# Patient Record
Sex: Female | Born: 1945 | Race: White | Hispanic: No | Marital: Married | Smoking: Current every day smoker
Health system: Southern US, Community
[De-identification: ages and names within clinical notes are randomized; demographics above are authoritative.]

## PROBLEM LIST (undated history)

## (undated) DIAGNOSIS — I10 Essential (primary) hypertension: Secondary | ICD-10-CM

## (undated) DIAGNOSIS — B019 Varicella without complication: Secondary | ICD-10-CM

## (undated) DIAGNOSIS — R42 Dizziness and giddiness: Secondary | ICD-10-CM

## (undated) DIAGNOSIS — M1990 Unspecified osteoarthritis, unspecified site: Secondary | ICD-10-CM

## (undated) DIAGNOSIS — R002 Palpitations: Secondary | ICD-10-CM

## (undated) DIAGNOSIS — J449 Chronic obstructive pulmonary disease, unspecified: Secondary | ICD-10-CM

## (undated) DIAGNOSIS — E785 Hyperlipidemia, unspecified: Secondary | ICD-10-CM

## (undated) HISTORY — PX: APPENDECTOMY: SHX54

## (undated) HISTORY — PX: COLONOSCOPY: SHX174

## (undated) HISTORY — PX: JOINT REPLACEMENT: SHX530

## (undated) HISTORY — PX: BUNIONECTOMY: SHX129

## (undated) HISTORY — PX: TOE SURGERY: SHX1073

---

## 1993-08-27 HISTORY — PX: BREAST EXCISIONAL BIOPSY: SUR124

## 2005-02-20 ENCOUNTER — Ambulatory Visit: Payer: Self-pay | Admitting: Internal Medicine

## 2005-12-17 ENCOUNTER — Ambulatory Visit: Payer: Self-pay | Admitting: Chiropractic Medicine

## 2006-02-21 ENCOUNTER — Ambulatory Visit: Payer: Self-pay | Admitting: Internal Medicine

## 2006-02-26 ENCOUNTER — Ambulatory Visit: Payer: Self-pay | Admitting: Internal Medicine

## 2006-09-19 ENCOUNTER — Emergency Department: Payer: Self-pay | Admitting: Emergency Medicine

## 2007-03-03 ENCOUNTER — Ambulatory Visit: Payer: Self-pay | Admitting: Internal Medicine

## 2008-01-09 ENCOUNTER — Other Ambulatory Visit: Payer: Self-pay

## 2008-01-09 ENCOUNTER — Inpatient Hospital Stay: Payer: Self-pay | Admitting: General Surgery

## 2008-01-09 ENCOUNTER — Ambulatory Visit: Payer: Self-pay

## 2008-05-04 ENCOUNTER — Ambulatory Visit: Payer: Self-pay | Admitting: Internal Medicine

## 2009-02-07 ENCOUNTER — Ambulatory Visit: Payer: Self-pay | Admitting: Internal Medicine

## 2009-03-25 ENCOUNTER — Ambulatory Visit: Payer: Self-pay | Admitting: Unknown Physician Specialty

## 2009-05-05 ENCOUNTER — Ambulatory Visit: Payer: Self-pay | Admitting: Internal Medicine

## 2009-10-25 ENCOUNTER — Ambulatory Visit: Payer: Self-pay | Admitting: General Practice

## 2010-05-10 ENCOUNTER — Ambulatory Visit: Payer: Self-pay | Admitting: Internal Medicine

## 2010-12-18 ENCOUNTER — Ambulatory Visit: Payer: Self-pay | Admitting: Unknown Physician Specialty

## 2011-11-21 ENCOUNTER — Ambulatory Visit: Payer: Self-pay | Admitting: Internal Medicine

## 2012-11-24 ENCOUNTER — Ambulatory Visit: Payer: Self-pay | Admitting: Internal Medicine

## 2014-01-25 ENCOUNTER — Ambulatory Visit: Payer: Self-pay | Admitting: Internal Medicine

## 2014-04-01 ENCOUNTER — Ambulatory Visit: Payer: Self-pay | Admitting: Orthopedic Surgery

## 2014-04-01 LAB — URINALYSIS, COMPLETE
BILIRUBIN, UR: NEGATIVE
Bacteria: NONE SEEN
Blood: NEGATIVE
GLUCOSE, UR: NEGATIVE mg/dL (ref 0–75)
Ketone: NEGATIVE
Leukocyte Esterase: NEGATIVE
Nitrite: NEGATIVE
PH: 6 (ref 4.5–8.0)
Protein: NEGATIVE
SPECIFIC GRAVITY: 1.017 (ref 1.003–1.030)
Squamous Epithelial: NONE SEEN
WBC UR: 1 /HPF (ref 0–5)

## 2014-04-01 LAB — BASIC METABOLIC PANEL
ANION GAP: 7 (ref 7–16)
BUN: 34 mg/dL — ABNORMAL HIGH (ref 7–18)
CO2: 26 mmol/L (ref 21–32)
CREATININE: 1.11 mg/dL (ref 0.60–1.30)
Calcium, Total: 9.6 mg/dL (ref 8.5–10.1)
Chloride: 104 mmol/L (ref 98–107)
EGFR (African American): 60 — ABNORMAL LOW
GFR CALC NON AF AMER: 52 — AB
Glucose: 84 mg/dL (ref 65–99)
OSMOLALITY: 281 (ref 275–301)
Potassium: 4 mmol/L (ref 3.5–5.1)
SODIUM: 137 mmol/L (ref 136–145)

## 2014-04-01 LAB — PROTIME-INR
INR: 1
Prothrombin Time: 12.6 secs (ref 11.5–14.7)

## 2014-04-01 LAB — CBC
HCT: 37.4 % (ref 35.0–47.0)
HGB: 12.8 g/dL (ref 12.0–16.0)
MCH: 35 pg — AB (ref 26.0–34.0)
MCHC: 34.2 g/dL (ref 32.0–36.0)
MCV: 102 fL — AB (ref 80–100)
Platelet: 282 10*3/uL (ref 150–440)
RBC: 3.65 10*6/uL — ABNORMAL LOW (ref 3.80–5.20)
RDW: 13.2 % (ref 11.5–14.5)
WBC: 9.3 10*3/uL (ref 3.6–11.0)

## 2014-04-01 LAB — APTT: Activated PTT: 30.9 secs (ref 23.6–35.9)

## 2014-04-01 LAB — MRSA PCR SCREENING

## 2014-04-01 LAB — SEDIMENTATION RATE: ERYTHROCYTE SED RATE: 21 mm/h (ref 0–30)

## 2014-04-15 ENCOUNTER — Inpatient Hospital Stay: Payer: Self-pay | Admitting: Orthopedic Surgery

## 2014-04-16 LAB — BASIC METABOLIC PANEL
Anion Gap: 9 (ref 7–16)
BUN: 19 mg/dL — ABNORMAL HIGH (ref 7–18)
CALCIUM: 8.7 mg/dL (ref 8.5–10.1)
CHLORIDE: 103 mmol/L (ref 98–107)
Co2: 28 mmol/L (ref 21–32)
Creatinine: 1.1 mg/dL (ref 0.60–1.30)
EGFR (African American): >60
EGFR (Non-African Amer.): 52 — ABNORMAL LOW
Glucose: 100 mg/dL — ABNORMAL HIGH (ref 65–99)
OSMOLALITY: 282 (ref 275–301)
Potassium: 3.7 mmol/L (ref 3.5–5.1)
Sodium: 140 mmol/L (ref 136–145)

## 2014-04-16 LAB — HEMOGLOBIN: HGB: 10.8 g/dL — ABNORMAL LOW (ref 12.0–16.0)

## 2014-04-16 LAB — PLATELET COUNT: PLATELETS: 275 10*3/uL (ref 150–440)

## 2014-04-17 LAB — BASIC METABOLIC PANEL
Anion Gap: 6 — ABNORMAL LOW (ref 7–16)
BUN: 12 mg/dL (ref 7–18)
CALCIUM: 8.9 mg/dL (ref 8.5–10.1)
CHLORIDE: 102 mmol/L (ref 98–107)
CO2: 28 mmol/L (ref 21–32)
Creatinine: 0.95 mg/dL (ref 0.60–1.30)
EGFR (African American): >60
Glucose: 95 mg/dL (ref 65–99)
OSMOLALITY: 272 (ref 275–301)
Potassium: 3.5 mmol/L (ref 3.5–5.1)
SODIUM: 136 mmol/L (ref 136–145)

## 2014-04-17 LAB — HEMOGLOBIN: HGB: 10 g/dL — AB (ref 12.0–16.0)

## 2014-04-19 ENCOUNTER — Encounter: Payer: Self-pay | Admitting: Internal Medicine

## 2014-04-20 LAB — PATHOLOGY REPORT

## 2014-04-27 ENCOUNTER — Encounter: Payer: Self-pay | Admitting: Internal Medicine

## 2014-06-13 HISTORY — PX: TOTAL HIP ARTHROPLASTY: SHX124

## 2014-12-18 NOTE — Op Note (Signed)
PATIENT NAME:  Tabitha Miller, Tabitha Miller MR#:  [id number] DATE OF BIRTH:  [birth year]  DATE OF PROCEDURE:  04/15/2014  PREOPERATIVE DIAGNOSES:  Severe left hip osteoarthritis.   POSTOPERATIVE DIAGNOSES:  Severe left hip osteoarthritis.   PROCEDURE: Left total hip replacement, direct anterior approach.   ANESTHESIA: Spinal.   SURGEON:  Laurene Footman, MD   DESCRIPTION OF PROCEDURE: The patient was brought to the operating room, and after adequate anesthesia was obtained, the patient was placed on the operative table with the right leg on a well-padded table, left foot in the Medacta attachment. After prepping and draping, and C-arm brought in to get good visualization of the proximal femur, and initial template, timeout procedure was completed with all patient identification, and an incision was made centered over the tensor fascia muscle, and the greater trochanter. The incision was carried down through the skin, and subcutaneous tissue in the direction of the TFL. The TFL fascia was incised. The muscle belly retracted laterally. The deep retractors placed, and the circumflex vessels cauterized as they were quite small. The anterior capsule was then exposed, and a capsulotomy created, which gave good visualization of the neck, and a good flap for subsequent retractor placement.   C-arm was brought in for assessment of the appropriate level of the femoral neck cut, and the femoral neck cut was carried out without difficulty, with the head removed, showing extensive wear, also present were loose bodies before the neck cut in the anterior joint space. The acetabulum also had extensive wear. The labrum was excised. Reaming was carried out to 52 mm, a 52 mm trial fit well with good bleeding bone present within the acetabulum; the 52 mm Versafitcup DM was impacted into place, and appeared to have appropriate lateral opening, and anteversion.   Next, the leg was externally rotated, the pubofemoral and ischiofemoral  ligaments released, and the leg dropped into extension. Sequential broaching was carried out to a size 3, which gave very good fill to the canal with a small, S 28 mm head trial. The hip was stable, and the leg length restored with normal offset. The final components were then obtained, and the AMIS 3 stem impacted followed by assembly of the dual mobility head. The head was assembled onto the stem impacted. The hip was reduced, and was stable at 90 degrees with traction.   The final x-ray taken intraoperatively showed appropriate position of components. The hip was thoroughly irrigated, hemostasis achieved with electrocautery. The deep tissue was infiltrated with 30 mL of 0.25% Sensorcaine with epinephrine. The tensor fascia was repaired using a heavy quill followed by 2-0 quill subcutaneously, and skin staples. Xeroform, 4 x 4's, ABD, and tape applied. The patient was sent to the recovery room in stable condition.   ESTIMATED BLOOD LOSS:  Was 400 mL.   COMPLICATIONS: None.   SPECIMEN: Removed femoral head.   IMPLANTS: Medacta Versafitcup DM 52 mm with liner S 28 mm head, and a size 3 standard AMIS stem.    ____________________________ Laurene Footman, MD mjm:nt D: 04/15/2014 17:52:45 ET T: 04/15/2014 18:25:26 ET JOB#: [id number]  cc: Laurene Footman, MD, <Dictator> Laurene Footman MD ELECTRONICALLY SIGNED 04/16/2014 7:15

## 2014-12-18 NOTE — Discharge Summary (Signed)
PATIENT NAME:  Tabitha Miller, Tabitha Miller MR#:  [id number] DATE OF BIRTH:  1946-05-08  DATE OF ADMISSION:  04/15/2014 DATE OF DISCHARGE:  04/19/2014    ADMITTING DIAGNOSIS:  Severe left hip osteoarthritis.   DISCHARGE DIAGNOSIS:  Severe left hip osteoarthritis.  OPERATION: On 04/15/2014 the patient had a left total hip replacement using anterior approach.   ANESTHESIA: Spinal.   SURGEON: Hessie Knows, M.D.   ESTIMATED BLOOD LOSS: 400 mL.   COMPLICATIONS: None.   IMPLANTS:  Medacta Versafitcup DM 52 mm with liner, S 28-mm head, size 3 standard AMIS stem.  The patient was stabilized, brought to the recovery room, and then brought down to the orthopedic floor.   HISTORY: The patient is a 69 year old female who presented for an upcoming left total hip replacement. The patient has difficulty with activities of daily living, even sitting in a car and some difficulty with driving. She has been refractory to conservative treatment.   PHYSICAL EXAMINATION:  GENERAL: Well-developed, well-nourished female with some difficulty with ambulation.  HEART: Regular rate and rhythm.  LUNGS: Clear to auscultation.  MUSCULOSKELETAL: In regard to the left lower extremity, the patient has a moderate limp with equal leg lengths. The patient has hip flexion that is 110 degrees, full extension and 15 degrees abduction with pain. The patient has limited external rotation at 30 degrees and internal rotation is neutral. The patient has x-rays revealing degenerative collapsed bone-on-bone pathology.   HOSPITAL COURSE: After initial admission on 04/15/2014, the patient was brought to the orthopedic floor. On postoperative day 1, the patient had a hemoglobin of 10.8, which dropped down to 10.0 on postoperative day 2. Her other labs remained stable. The patient worked with physical therapy, initially bed to chair and progressed up to ambulating 80 feet with therapy and doing quite well. The patient had some difficulties because of a  right shoulder problem with trying to get up from the bed. The patient was then sent to rehab on 04/19/2014.   CONDITION AT DISCHARGE: Stable.   DISPOSITION: The patient was sent to rehabilitation for physical therapy and occupational therapy.   DISCHARGE INSTRUCTIONS: The patient will do weight-bearing as tolerated on the affected leg. The patient will elevate with 1-2 pillows to decrease swelling. The patient will use anterior hip precautions. The patient will use knee-high TED hose on both legs, removed 1 hour every 8-hour shift. The patient will elevate her heels and use the incentive spirometer every hour while awake and be encouraged to do cough and deep breathing. The patient will have a regular diet. The patient will keep her dressing clean and dry, try not to get it wet, and have a dressing change on a p.r.n. basis. The patient will call the clinic if there is any bright red bleeding, calf pain, bowel or bladder difficulty, or any fever greater than 101.5. The patient will do physical therapy per protocol and occupational therapy per protocol. The patient will follow up in 2 weeks for staple removal with Dr. Rudene Christians.   DISCHARGE MEDICATIONS: Vitamin D3 1000 International Units 3 capsules daily, vitamin C 500 mg 2 tablets twice a day, flaxseed oil 3 capsules daily, ginkgo biloba 1 capsule daily, Biotin 1 capsule daily, garlic 2 capsules daily, Zyrtec 2 mg 1 tablet daily, calcium with vitamin D 3 tablets daily, red yeast rice 600 mg 2 tablets daily, Centrum Silver Women's vitamin 1 tablet daily, zinc gluconate 50 mg 1 tablet daily, vitamin E 400 International Units 1 capsule daily, magnesium oxide 400  mg 1 tablet daily, hydrochlorothiazide/triamterene 25/37.5 mg 1 tablet daily, atenolol 25 mg 1 tablet at bedtime, fluoxetine 20 mg 1 capsule at bedtime, turmeric oral 1 capsule daily, grape seed extract 200 mg 1 capsule daily, Naphcon ophthalmic solution 1 drop in each eye daily, green tea extract 315 mg 2  capsules daily, oil of oregano 1500 mg 1 capsule daily, elderberry sambucus 400 mg 1 capsule daily, glucosamine chondroitin 2 capsules daily, tramadol 50 mg 1 tablet q. 6 hours p.r.n. for less severe pain, Tylenol 500 mg 1 tablet q. 4 hours for pain or fever, oxycodone 10 mg extended release 1. 12 hours, oxycodone 5 mg 1 tablet q. 4 hours as needed for moderate to severe pain, Lovenox 40 mg subcutaneously once a day for 14 days, then discontinue and begin aspirin 81 mg daily, Senokot-S 1 tablet b.i.d., bisacodyl 10 mg rectally p.r.n. for constipation.    ____________________________ Lenna Sciara. Reche Dixon, [place] jtm:lt D: 04/19/2014 07:39:26 ET T: 04/19/2014 08:38:08 ET JOB#: [id number]  cc: J. Reche Dixon, [place], <Dictator> J Analysse Quinonez [facility] ELECTRONICALLY SIGNED 04/20/2014 8:42

## 2014-12-18 NOTE — Op Note (Signed)
PATIENT NAME:  Tabitha Miller, Tabitha Miller MR#:  [id number] DATE OF BIRTH:  Jan 16, 1946  DATE OF PROCEDURE:  04/15/2014  ADDENDUM:  ADDITIONAL DIAGNOSIS: Right shoulder osteoarthritis.   POSTOPERATIVE DIAGNOSIS: Right shoulder osteoarthritis.  PROCEDURE: Right shoulder intra-articular injection.   ANESTHESIA: Spinal with sedation.   DESCRIPTION OF PROCEDURE: The patient identification and timeout procedures were completed. The anterior shoulder was prepped with Betadine. A 25-gauge needle was inserted into the joint and the shoulder gently rotated to make certain we were inside the joint. Then, 40 mg of Kenalog 1 mL was injected without difficulty and a Band-Aid applied. The patient tolerated the procedure well. Following this, the total hip was performed under a separate operative note.   ____________________________ Laurene Footman, MD mjm:TT D: 04/15/2014 21:26:49 ET T: 04/15/2014 21:34:59 ET JOB#: [id number]  cc: Laurene Footman, MD, <Dictator> Laurene Footman MD ELECTRONICALLY SIGNED 04/16/2014 7:15

## 2015-01-04 ENCOUNTER — Other Ambulatory Visit: Payer: Self-pay | Admitting: Internal Medicine

## 2015-01-04 DIAGNOSIS — Z1231 Encounter for screening mammogram for malignant neoplasm of breast: Secondary | ICD-10-CM

## 2015-01-27 ENCOUNTER — Ambulatory Visit: Payer: Self-pay

## 2015-02-04 ENCOUNTER — Ambulatory Visit
Admission: RE | Admit: 2015-02-04 | Discharge: 2015-02-04 | Disposition: A | Discharge status: Home / Self Care | Payer: Medicare PPO | Source: Ambulatory Visit | Attending: Internal Medicine | Admitting: Internal Medicine

## 2015-02-04 ENCOUNTER — Other Ambulatory Visit: Payer: Self-pay | Admitting: Internal Medicine

## 2015-02-04 DIAGNOSIS — Z1231 Encounter for screening mammogram for malignant neoplasm of breast: Secondary | ICD-10-CM

## 2016-01-13 ENCOUNTER — Other Ambulatory Visit: Payer: Self-pay | Admitting: Internal Medicine

## 2016-01-13 DIAGNOSIS — Z1231 Encounter for screening mammogram for malignant neoplasm of breast: Secondary | ICD-10-CM

## 2016-02-06 ENCOUNTER — Other Ambulatory Visit: Payer: Self-pay | Admitting: Internal Medicine

## 2016-02-06 ENCOUNTER — Ambulatory Visit
Admission: RE | Admit: 2016-02-06 | Discharge: 2016-02-06 | Disposition: A | Discharge status: Home / Self Care | Payer: Medicare Other | Source: Ambulatory Visit | Attending: Internal Medicine | Admitting: Internal Medicine

## 2016-02-06 DIAGNOSIS — Z1231 Encounter for screening mammogram for malignant neoplasm of breast: Secondary | ICD-10-CM | POA: Diagnosis present

## 2016-02-17 ENCOUNTER — Other Ambulatory Visit: Payer: Self-pay | Admitting: Internal Medicine

## 2016-02-17 DIAGNOSIS — R1084 Generalized abdominal pain: Secondary | ICD-10-CM

## 2016-02-24 ENCOUNTER — Ambulatory Visit
Admission: RE | Admit: 2016-02-24 | Discharge: 2016-02-24 | Disposition: A | Discharge status: Home / Self Care | Payer: Medicare Other | Source: Ambulatory Visit | Attending: Internal Medicine | Admitting: Internal Medicine

## 2016-02-24 DIAGNOSIS — K824 Cholesterolosis of gallbladder: Secondary | ICD-10-CM | POA: Diagnosis not present

## 2016-02-24 DIAGNOSIS — N281 Cyst of kidney, acquired: Secondary | ICD-10-CM | POA: Diagnosis not present

## 2016-02-24 DIAGNOSIS — N1330 Unspecified hydronephrosis: Secondary | ICD-10-CM | POA: Insufficient documentation

## 2016-02-24 DIAGNOSIS — R1084 Generalized abdominal pain: Secondary | ICD-10-CM | POA: Insufficient documentation

## 2016-02-29 ENCOUNTER — Encounter: Payer: Self-pay | Admitting: *Deleted

## 2016-03-02 NOTE — Discharge Instructions (Signed)
[facility] °[facility] ° °POST OPERATIVE INSTRUCTIONS FOR DR. TROXLER AND DR. FOWLER °[facility] ° ° °1. Take your medication as prescribed.  Pain medication should be taken only as needed. ° °2. Keep the dressing clean, dry and intact. ° °3. Keep your foot elevated above the heart level for the first 48 hours. ° °4. Walking to the bathroom and brief periods of walking are acceptable, unless we have instructed you to be non-weight bearing. ° °5. Always wear your post-op shoe when walking.  Always use your crutches if you are to be non-weight bearing. ° °6. Do not take a shower. Baths are permissible as long as the foot is kept out of the water.  ° °7. Every hour you are awake:  °- Bend your knee 15 times. °- Flex foot 15 times °- Massage calf 15 times ° °8. Call [facility] ([phone]) if any of the following problems occur: °- You develop a temperature or fever. °- The bandage becomes saturated with blood. °- Medication does not stop your pain. °- Injury of the foot occurs. °- Any symptoms of infection including redness, odor, or red streaks running from wound. ° °General Anesthesia, Adult, Care After °Refer to this sheet in the next few weeks. These instructions provide you with information on caring for yourself after your procedure. Your health care provider may also give you more specific instructions. Your treatment has been planned according to current medical practices, but problems sometimes occur. Call your health care provider if you have any problems or questions after your procedure. °WHAT TO EXPECT AFTER THE PROCEDURE °After the procedure, it is typical to experience: °· Sleepiness. °· Nausea and vomiting. °HOME CARE INSTRUCTIONS °· For the first 24 hours after general anesthesia: °¨ Have a responsible person with you. °¨ Do not drive a car. If you are alone, do not take public transportation. °¨ Do not drink alcohol. °¨ Do not take  medicine that has not been prescribed by your health care provider. °¨ Do not sign important papers or make important decisions. °¨ You may resume a normal diet and activities as directed by your health care provider. °· Change bandages (dressings) as directed. °· If you have questions or problems that seem related to general anesthesia, call the hospital and ask for the anesthetist or anesthesiologist on call. °SEEK MEDICAL CARE IF: °· You have nausea and vomiting that continue the day after anesthesia. °· You develop a rash. °SEEK IMMEDIATE MEDICAL CARE IF:  °· You have difficulty breathing. °· You have chest pain. °· You have any allergic problems. °  °This information is not intended to replace advice given to you by your health care provider. Make sure you discuss any questions you have with your health care provider. °  °Document Released: 11/19/2000 Document Revised: 09/03/2014 Document Reviewed: 12/12/2011 °Elsevier Interactive Patient Education ©2016 [employer] ° °

## 2016-03-08 ENCOUNTER — Ambulatory Visit: Payer: Medicare Other | Admitting: Anesthesiology

## 2016-03-08 ENCOUNTER — Encounter
Admission: RE | Disposition: A | Discharge status: Home / Self Care | Payer: Self-pay | Source: Ambulatory Visit | Attending: Podiatry

## 2016-03-08 ENCOUNTER — Ambulatory Visit
Admission: RE | Admit: 2016-03-08 | Discharge: 2016-03-08 | Disposition: A | Discharge status: Home / Self Care | Payer: Medicare Other | Source: Ambulatory Visit | Attending: Podiatry | Admitting: Podiatry

## 2016-03-08 DIAGNOSIS — I10 Essential (primary) hypertension: Secondary | ICD-10-CM | POA: Diagnosis not present

## 2016-03-08 DIAGNOSIS — J449 Chronic obstructive pulmonary disease, unspecified: Secondary | ICD-10-CM | POA: Diagnosis not present

## 2016-03-08 DIAGNOSIS — G8929 Other chronic pain: Secondary | ICD-10-CM | POA: Insufficient documentation

## 2016-03-08 DIAGNOSIS — F17200 Nicotine dependence, unspecified, uncomplicated: Secondary | ICD-10-CM | POA: Diagnosis not present

## 2016-03-08 DIAGNOSIS — M1990 Unspecified osteoarthritis, unspecified site: Secondary | ICD-10-CM | POA: Insufficient documentation

## 2016-03-08 DIAGNOSIS — D1631 Benign neoplasm of short bones of right lower limb: Secondary | ICD-10-CM | POA: Insufficient documentation

## 2016-03-08 DIAGNOSIS — M898X7 Other specified disorders of bone, ankle and foot: Secondary | ICD-10-CM

## 2016-03-08 HISTORY — DX: Unspecified osteoarthritis, unspecified site: M19.90

## 2016-03-08 HISTORY — DX: Dizziness and giddiness: R42

## 2016-03-08 HISTORY — DX: Chronic obstructive pulmonary disease, unspecified: J44.9

## 2016-03-08 HISTORY — DX: Hyperlipidemia, unspecified: E78.5

## 2016-03-08 HISTORY — DX: Essential (primary) hypertension: I10

## 2016-03-08 HISTORY — PX: EXCISION PARTIAL PHALANX: SHX6617

## 2016-03-08 HISTORY — PX: STERIOD INJECTION: SHX5046

## 2016-03-08 SURGERY — EXCISION, PHALANX, PARTIAL
Anesthesia: Monitor Anesthesia Care | Site: Foot | Laterality: Right | Wound class: Clean

## 2016-03-08 MED ORDER — OXYCODONE HCL 5 MG PO TABS: 5.0000 mg | ORAL_TABLET | Freq: Once | ORAL | Status: DC | PRN

## 2016-03-08 MED ORDER — HYDROMORPHONE HCL 1 MG/ML IJ SOLN: 0.2500 mg | INTRAMUSCULAR | Status: DC | PRN

## 2016-03-08 MED ORDER — BUPIVACAINE HCL 0.5 % IJ SOLN
INTRAMUSCULAR | Status: DC | PRN
  Administered 2016-03-08: 2 mL via INTRAMUSCULAR

## 2016-03-08 MED ORDER — LIDOCAINE HCL (PF) 1 % IJ SOLN
INTRAMUSCULAR | Status: DC | PRN
  Administered 2016-03-08: 1 mL

## 2016-03-08 MED ORDER — OXYCODONE HCL 5 MG/5ML PO SOLN: 5.0000 mg | Freq: Once | ORAL | Status: DC | PRN

## 2016-03-08 MED ORDER — PROMETHAZINE HCL 25 MG/ML IJ SOLN: 6.2500 mg | INTRAMUSCULAR | Status: DC | PRN

## 2016-03-08 MED ORDER — LACTATED RINGERS IV SOLN
INTRAVENOUS | Status: DC
  Administered 2016-03-08: 07:00:00 via INTRAVENOUS

## 2016-03-08 MED ORDER — PROPOFOL 500 MG/50ML IV EMUL
INTRAVENOUS | Status: DC | PRN
  Administered 2016-03-08: 75 ug/kg/min via INTRAVENOUS
  Administered 2016-03-08: 100 ug/kg/min via INTRAVENOUS

## 2016-03-08 MED ORDER — LIDOCAINE HCL (CARDIAC) 20 MG/ML IV SOLN
INTRAVENOUS | Status: DC | PRN
  Administered 2016-03-08: 50 mg via INTRAVENOUS

## 2016-03-08 MED ORDER — MEPERIDINE HCL 25 MG/ML IJ SOLN: 6.2500 mg | INTRAMUSCULAR | Status: DC | PRN

## 2016-03-08 MED ORDER — MIDAZOLAM HCL 2 MG/2ML IJ SOLN
INTRAMUSCULAR | Status: DC | PRN
  Administered 2016-03-08: 2 mg via INTRAVENOUS

## 2016-03-08 MED ORDER — HYDROCODONE-ACETAMINOPHEN 7.5-325 MG PO TABS: 1.0000 | ORAL_TABLET | Freq: Four times a day (QID) | ORAL | Status: DC | PRN

## 2016-03-08 MED ORDER — CEFAZOLIN SODIUM-DEXTROSE 2-4 GM/100ML-% IV SOLN
2.0000 g | Freq: Once | INTRAVENOUS | Status: AC
  Administered 2016-03-08: 2 g via INTRAVENOUS

## 2016-03-08 MED ORDER — LACTATED RINGERS IV SOLN: INTRAVENOUS | Status: DC

## 2016-03-08 MED ORDER — BUPIVACAINE HCL (PF) 0.5 % IJ SOLN
INTRAMUSCULAR | Status: DC | PRN
  Administered 2016-03-08: 1 mL

## 2016-03-08 MED ORDER — FENTANYL CITRATE (PF) 100 MCG/2ML IJ SOLN
INTRAMUSCULAR | Status: DC | PRN
  Administered 2016-03-08: 50 ug via INTRAVENOUS

## 2016-03-08 SURGICAL SUPPLY — 63 items
BANDAGE ELASTIC 4 CLIP NS LF (GAUZE/BANDAGES/DRESSINGS) ×4 IMPLANT
BENZOIN TINCTURE PRP APPL 2/3 (GAUZE/BANDAGES/DRESSINGS) ×4 IMPLANT
BLADE CRESCENTIC (BLADE) IMPLANT
BLADE MED AGGRESSIVE (BLADE) IMPLANT
BLADE MINI RND TIP GREEN BEAV (BLADE) IMPLANT
BLADE OSC/SAGITTAL 5.5X25 (BLADE) IMPLANT
BLADE OSC/SAGITTAL MD 5.5X18 (BLADE) IMPLANT
BLADE OSC/SAGITTAL MD 9X18.5 (BLADE) IMPLANT
BLADE OSCILLATING/SAGITTAL (BLADE)
BLADE SW THK.38XMED LNG THN (BLADE) IMPLANT
BNDG ESMARK 4X12 TAN STRL LF (GAUZE/BANDAGES/DRESSINGS) ×4 IMPLANT
BNDG GAUZE 4.5X4.1 6PLY STRL (MISCELLANEOUS) ×4 IMPLANT
BNDG STRETCH 4X75 STRL LF (GAUZE/BANDAGES/DRESSINGS) ×4 IMPLANT
BUR EGG 4X8 MED (BURR) IMPLANT
BUR STRYKR EGG 5.0 (BURR) IMPLANT
CANISTER SUCT 1200ML W/VALVE (MISCELLANEOUS) ×4 IMPLANT
CAST PADDING 3X4FT ST 30246 (SOFTGOODS)
CLOSURE WOUND 1/4X4 (GAUZE/BANDAGES/DRESSINGS)
COVER LIGHT HANDLE UNIVERSAL (MISCELLANEOUS) ×8 IMPLANT
COVER PIN YLW 0.028-062 (MISCELLANEOUS) IMPLANT
CUFF TOURN SGL QUICK 18 (TOURNIQUET CUFF) ×4 IMPLANT
DRAPE FLUOR MINI C-ARM 54X84 (DRAPES) ×4 IMPLANT
DRILL WIRE PASS (DRILL) IMPLANT
DURAPREP 26ML APPLICATOR (WOUND CARE) ×4 IMPLANT
GAUZE PETRO XEROFOAM 1X8 (MISCELLANEOUS) ×4 IMPLANT
GAUZE SPONGE 4X4 12PLY STRL (GAUZE/BANDAGES/DRESSINGS) ×4 IMPLANT
GLOVE BIO SURGEON STRL SZ8 (GLOVE) ×4 IMPLANT
GOWN STRL REUS W/ TWL LRG LVL3 (GOWN DISPOSABLE) ×2 IMPLANT
GOWN STRL REUS W/ TWL XL LVL3 (GOWN DISPOSABLE) ×2 IMPLANT
GOWN STRL REUS W/TWL LRG LVL3 (GOWN DISPOSABLE) ×2
GOWN STRL REUS W/TWL XL LVL3 (GOWN DISPOSABLE) ×2
K-WIRE DBL END TROCAR 6X.045 (WIRE)
K-WIRE DBL END TROCAR 6X.062 (WIRE)
KIT ROOM TURNOVER OR (KITS) ×4 IMPLANT
KWIRE DBL END TROCAR 6X.045 (WIRE) IMPLANT
KWIRE DBL END TROCAR 6X.062 (WIRE) IMPLANT
NEEDLE HYPO 18GX1.5 BLUNT FILL (NEEDLE) IMPLANT
NEEDLE HYPO 25GX1X1/2 BEV (NEEDLE) IMPLANT
NS IRRIG 500ML POUR BTL (IV SOLUTION) ×4 IMPLANT
PACK EXTREMITY ARMC (MISCELLANEOUS) ×4 IMPLANT
PAD CAST CTTN 3X4 STRL (SOFTGOODS) IMPLANT
PAD GROUND ADULT SPLIT (MISCELLANEOUS) ×4 IMPLANT
RASP SM TEAR CROSS CUT (RASP) ×4 IMPLANT
SPLINT CAST 1 STEP 4X30 (MISCELLANEOUS) ×4 IMPLANT
SPLINT FAST PLASTER 5X30 (CAST SUPPLIES)
SPLINT PLASTER CAST FAST 5X30 (CAST SUPPLIES) IMPLANT
STOCKINETTE STRL 6IN 960660 (GAUZE/BANDAGES/DRESSINGS) ×4 IMPLANT
STRAP BODY AND KNEE 60X3 (MISCELLANEOUS) ×4 IMPLANT
STRIP CLOSURE SKIN 1/4X4 (GAUZE/BANDAGES/DRESSINGS) IMPLANT
SUT ETHILON 4-0 (SUTURE)
SUT ETHILON 4-0 FS2 18XMFL BLK (SUTURE)
SUT ETHILON 5-0 FS-2 18 BLK (SUTURE) ×4 IMPLANT
SUT VIC AB 1 CT1 36 (SUTURE) ×4 IMPLANT
SUT VIC AB 2-0 CT1 27 (SUTURE)
SUT VIC AB 2-0 CT1 TAPERPNT 27 (SUTURE) IMPLANT
SUT VIC AB 2-0 SH 27 (SUTURE)
SUT VIC AB 2-0 SH 27XBRD (SUTURE) IMPLANT
SUT VIC AB 3-0 SH 27 (SUTURE)
SUT VIC AB 3-0 SH 27X BRD (SUTURE) IMPLANT
SUT VIC AB 4-0 FS2 27 (SUTURE) IMPLANT
SUT VICRYL AB 3-0 FS1 BRD 27IN (SUTURE) IMPLANT
SUTURE ETHLN 4-0 FS2 18XMF BLK (SUTURE) IMPLANT
SYRINGE 10CC LL (SYRINGE) IMPLANT

## 2016-03-08 NOTE — Anesthesia Postprocedure Evaluation (Signed)
Anesthesia Post Note  Patient: Tabitha Miller  Procedure(s) Performed: Procedure(s) (LRB): EXCISION PARTIAL PHALANX 5TH RT TOE (Right)  Patient location during evaluation: PACU Anesthesia Type: MAC Level of consciousness: awake and alert and oriented Pain management: pain level controlled Vital Signs Assessment: post-procedure vital signs reviewed and stable Respiratory status: spontaneous breathing and nonlabored ventilation Cardiovascular status: stable Postop Assessment: no signs of nausea or vomiting and adequate PO intake Anesthetic complications: no    Estill Batten

## 2016-03-08 NOTE — Transfer of Care (Signed)
Immediate Anesthesia Transfer of Care Note  Patient: Tabitha Miller  Procedure(s) Performed: Procedure(s) with comments: EXCISION PARTIAL PHALANX 5TH RT TOE (Right) - IVA WITH LOCAL  Patient Location: PACU  Anesthesia Type: MAC  Level of Consciousness: awake, alert  and patient cooperative  Airway and Oxygen Therapy: Patient Spontanous Breathing and Patient connected to supplemental oxygen  Post-op Assessment: Post-op Vital signs reviewed, Patient's Cardiovascular Status Stable, Respiratory Function Stable, Patent Airway and No signs of Nausea or vomiting  Post-op Vital Signs: Reviewed and stable  Complications: No apparent anesthesia complications

## 2016-03-08 NOTE — Op Note (Signed)
Operative note   Surgeon: Dr. Albertine Patricia, DPM.    Assistant: None    Preop diagnosis: Exostosis fifth toe right foot    Postop diagnosis: Same    Procedure:   1. Phalanx head resection proximal phalanx and partial middle phalanx resection fifth toe right foot          EBL: Less than 5 cc    Anesthesia:IV sedation delivered by anesthesia team and local block with 0.5% Marcaine and 1% lidocaine mixed 50-50 injected the base of the toe tablet 2 cc were used.    Hemostasis: Ankle tourniquet 250 mils mercury pressure    Specimen: None    Complications: None    Operative indications: Chronic pain and recurrence of a painful lesion in between the fourth and fifth toes secondary to the exostosis. Nonresponsive to conservative care    Procedure:  Patient was brought into the OR and placed on the operating table in thesupine position. After anesthesia was obtained theright lower extremity was prepped and draped in usual sterile fashion.  Operative Report: Attention was directed to the fifth toe of the right foot where a 1.5 cm linear incision was made and deepened sharp blunt dissection. The extensor tendon was identified and incised transversely reflected proximally the PIPJ level. The proximal phalanx was identified and resected with power equipment. His to the middle phalanx medially was then freed up and the sagittal saw was used to make a cut through the medial one fourth to remove that segment of the proximal phalanx which is prominent also. At this point a power rasp was used to smooth down both proximal phalanx head as well as the middle phalanx medial body. There is an copiously irrigated checked FluoroScan adequate reduction removal bone was noted. After copious irrigation the extensor tendon and medial lateral collateral ligaments were sutured with 4-0 Vicryl simple interrupted sutures. Skin was reapproximated with 5-0 nylon horizontal mattress and simple interrupted sutures.  There is a blocked with 0.5% Marcaine plain and 1 cc. A sterile compressive dressings were placed across wound consisting of 0 gauze 4 x 4's Kling Kerlix. Tourniquet was released and prompt complete vascularity was seen to return all digits of the right foot.    Patient tolerated the procedure and anesthesia well.  Was transported from the OR to the PACU with all vital signs stable and vascular status intact. To be discharged per routine protocol.  Will follow up in approximately 1 week in the outpatient clinic.

## 2016-03-08 NOTE — Anesthesia Preprocedure Evaluation (Signed)
Anesthesia Evaluation  Patient identified by MRN, date of birth, ID band Patient awake    Reviewed: Allergy & Precautions, NPO status , Unable to perform ROS - Chart review only  Airway Mallampati: I  TM Distance: >3 FB Neck ROM: Full    Dental no notable dental hx.    Pulmonary COPD, Current Smoker,    Pulmonary exam normal        Cardiovascular hypertension, Normal cardiovascular exam     Neuro/Psych negative neurological ROS  negative psych ROS   GI/Hepatic negative GI ROS, Neg liver ROS,   Endo/Other  negative endocrine ROS  Renal/GU negative Renal ROS     Musculoskeletal  (+) Arthritis , Osteoarthritis,    Abdominal   Peds  Hematology negative hematology ROS (+)   Anesthesia Other Findings   Reproductive/Obstetrics                             Anesthesia Physical Anesthesia Plan  ASA: II  Anesthesia Plan: MAC   Post-op Pain Management:    Induction: Intravenous  Airway Management Planned:   Additional Equipment:   Intra-op Plan:   Post-operative Plan:   Informed Consent: I have reviewed the patients History and Physical, chart, labs and discussed the procedure including the risks, benefits and alternatives for the proposed anesthesia with the patient or authorized representative who has indicated his/her understanding and acceptance.     Plan Discussed with: CRNA  Anesthesia Plan Comments:         Anesthesia Quick Evaluation

## 2016-03-08 NOTE — H&P (Signed)
H and P has been reviewed and no changes are noted.  

## 2016-03-08 NOTE — Anesthesia Procedure Notes (Signed)
Procedure Name: MAC Performed by: Maiko Salais Pre-anesthesia Checklist: Patient identified, Emergency Drugs available, Suction available, Patient being monitored and Timeout performed Patient Re-evaluated:Patient Re-evaluated prior to inductionOxygen Delivery Method: Simple face mask Placement Confirmation: positive ETCO2 and breath sounds checked- equal and bilateral       

## 2016-03-09 ENCOUNTER — Encounter: Payer: Self-pay | Admitting: Podiatry

## 2017-03-28 ENCOUNTER — Other Ambulatory Visit: Payer: Self-pay | Admitting: Internal Medicine

## 2017-03-28 DIAGNOSIS — Z1231 Encounter for screening mammogram for malignant neoplasm of breast: Secondary | ICD-10-CM

## 2017-04-24 ENCOUNTER — Ambulatory Visit
Admission: RE | Admit: 2017-04-24 | Discharge: 2017-04-24 | Disposition: A | Discharge status: Home / Self Care | Payer: Medicare Other | Source: Ambulatory Visit | Attending: Internal Medicine | Admitting: Internal Medicine

## 2017-04-24 DIAGNOSIS — Z1231 Encounter for screening mammogram for malignant neoplasm of breast: Secondary | ICD-10-CM | POA: Diagnosis not present

## 2017-07-10 ENCOUNTER — Other Ambulatory Visit: Payer: Self-pay | Admitting: Orthopedic Surgery

## 2017-07-10 DIAGNOSIS — M1711 Unilateral primary osteoarthritis, right knee: Secondary | ICD-10-CM

## 2017-07-10 DIAGNOSIS — M1712 Unilateral primary osteoarthritis, left knee: Secondary | ICD-10-CM

## 2017-07-17 ENCOUNTER — Ambulatory Visit
Admission: RE | Admit: 2017-07-17 | Discharge: 2017-07-17 | Disposition: A | Discharge status: Home / Self Care | Payer: Medicare Other | Source: Ambulatory Visit | Attending: Orthopedic Surgery | Admitting: Orthopedic Surgery

## 2017-07-17 DIAGNOSIS — M170 Bilateral primary osteoarthritis of knee: Secondary | ICD-10-CM | POA: Insufficient documentation

## 2017-07-17 DIAGNOSIS — M25462 Effusion, left knee: Secondary | ICD-10-CM | POA: Insufficient documentation

## 2017-07-17 DIAGNOSIS — M7121 Synovial cyst of popliteal space [Baker], right knee: Secondary | ICD-10-CM | POA: Diagnosis not present

## 2017-07-17 DIAGNOSIS — M25461 Effusion, right knee: Secondary | ICD-10-CM | POA: Diagnosis not present

## 2017-07-17 DIAGNOSIS — M1712 Unilateral primary osteoarthritis, left knee: Secondary | ICD-10-CM

## 2017-07-17 DIAGNOSIS — M1711 Unilateral primary osteoarthritis, right knee: Secondary | ICD-10-CM

## 2017-09-02 ENCOUNTER — Other Ambulatory Visit: Payer: Self-pay | Admitting: Internal Medicine

## 2017-09-02 DIAGNOSIS — R1032 Left lower quadrant pain: Secondary | ICD-10-CM

## 2017-09-04 ENCOUNTER — Inpatient Hospital Stay: Admission: RE | Admit: 2017-09-04 | Payer: Medicare Other | Source: Ambulatory Visit

## 2017-09-05 ENCOUNTER — Encounter
Admission: RE | Admit: 2017-09-05 | Discharge: 2017-09-05 | Disposition: A | Discharge status: Home / Self Care | Payer: Medicare Other | Source: Ambulatory Visit | Attending: Orthopedic Surgery | Admitting: Orthopedic Surgery

## 2017-09-05 ENCOUNTER — Other Ambulatory Visit: Payer: Self-pay

## 2017-09-05 DIAGNOSIS — J449 Chronic obstructive pulmonary disease, unspecified: Secondary | ICD-10-CM | POA: Insufficient documentation

## 2017-09-05 DIAGNOSIS — R002 Palpitations: Secondary | ICD-10-CM | POA: Insufficient documentation

## 2017-09-05 DIAGNOSIS — R001 Bradycardia, unspecified: Secondary | ICD-10-CM | POA: Diagnosis not present

## 2017-09-05 DIAGNOSIS — M170 Bilateral primary osteoarthritis of knee: Secondary | ICD-10-CM | POA: Insufficient documentation

## 2017-09-05 DIAGNOSIS — Z01818 Encounter for other preprocedural examination: Secondary | ICD-10-CM | POA: Diagnosis present

## 2017-09-05 DIAGNOSIS — Z01812 Encounter for preprocedural laboratory examination: Secondary | ICD-10-CM | POA: Insufficient documentation

## 2017-09-05 DIAGNOSIS — Z0183 Encounter for blood typing: Secondary | ICD-10-CM | POA: Diagnosis not present

## 2017-09-05 DIAGNOSIS — Z91012 Allergy to eggs: Secondary | ICD-10-CM | POA: Diagnosis not present

## 2017-09-05 HISTORY — DX: Varicella without complication: B01.9

## 2017-09-05 HISTORY — DX: Palpitations: R00.2

## 2017-09-05 LAB — TYPE AND SCREEN
ABO/RH(D): O POS
ANTIBODY SCREEN: NEGATIVE

## 2017-09-05 LAB — URINALYSIS, ROUTINE W REFLEX MICROSCOPIC
BILIRUBIN URINE: NEGATIVE
Glucose, UA: NEGATIVE mg/dL
Hgb urine dipstick: NEGATIVE
Leukocytes, UA: NEGATIVE
NITRITE: NEGATIVE
PROTEIN: NEGATIVE mg/dL
Specific Gravity, Urine: 1.015 (ref 1.005–1.030)
pH: 6 (ref 5.0–8.0)

## 2017-09-05 LAB — BASIC METABOLIC PANEL
Anion gap: 11 (ref 5–15)
BUN: 20 mg/dL (ref 6–20)
CHLORIDE: 97 mmol/L — AB (ref 101–111)
CO2: 26 mmol/L (ref 22–32)
CREATININE: 0.96 mg/dL (ref 0.44–1.00)
Calcium: 9.3 mg/dL (ref 8.9–10.3)
GFR calc non Af Amer: 59 mL/min — ABNORMAL LOW (ref >60)
Glucose, Bld: 86 mg/dL (ref 65–99)
POTASSIUM: 3.5 mmol/L (ref 3.5–5.1)
Sodium: 134 mmol/L — ABNORMAL LOW (ref 135–145)

## 2017-09-05 LAB — SEDIMENTATION RATE: Sed Rate: 32 mm/hr — ABNORMAL HIGH (ref 0–30)

## 2017-09-05 LAB — CBC
HEMATOCRIT: 38.4 % (ref 35.0–47.0)
HEMOGLOBIN: 13.1 g/dL (ref 12.0–16.0)
MCH: 34.5 pg — ABNORMAL HIGH (ref 26.0–34.0)
MCHC: 34.1 g/dL (ref 32.0–36.0)
MCV: 101 fL — ABNORMAL HIGH (ref 80.0–100.0)
Platelets: 339 10*3/uL (ref 150–440)
RBC: 3.8 MIL/uL (ref 3.80–5.20)
RDW: 14.1 % (ref 11.5–14.5)
WBC: 7.8 10*3/uL (ref 3.6–11.0)

## 2017-09-05 LAB — APTT: APTT: 33 s (ref 24–36)

## 2017-09-05 LAB — PROTIME-INR
INR: 0.87
PROTHROMBIN TIME: 11.7 s (ref 11.4–15.2)

## 2017-09-05 LAB — SURGICAL PCR SCREEN
MRSA, PCR: NEGATIVE
Staphylococcus aureus: NEGATIVE

## 2017-09-05 NOTE — Patient Instructions (Signed)
Your procedure is scheduled on: Thursday, September 19, 2017 Report to Same Day Surgery on the 2nd floor in the [facility]. To find out your arrival time, please call [phone] between 1PM - 3PM on: Wednesday, September 18, 2017  REMEMBER: Instructions that are not followed completely may result in serious medical risk, up to and including death; or upon the discretion of your surgeon and anesthesiologist your surgery may need to be rescheduled.  Do not eat food after midnight the night before your procedure.  No gum chewing or hard candies.  You may however, drink CLEAR liquids up to 2 hours before you are scheduled to arrive at the hospital for your procedure.  Do not drink clear liquids within 2 hours of the start of your surgery.  Clear liquids include: - water  - apple juice without pulp - clear gatorade - black coffee or tea (Do NOT add anything to the coffee or tea) Do NOT drink anything that is not on this list.  No Alcohol for 24 hours before or after surgery.  No Smoking including e-cigarettes for 24 hours prior to surgery. No chewable tobacco products for at least 6 hours prior to surgery. No nicotine patches on the day of surgery.  Notify your doctor if there is any change in your medical condition (cold, fever, infection).  Do not wear jewelry, make-up, hairpins, clips or nail polish.  Do not wear lotions, powders, or perfumes. You may wear deodorant.  Do not shave 48 hours prior to surgery.   Contacts and dentures may not be worn into surgery.  Do not bring valuables to the hospital. [facility] is not responsible for any belongings or valuables.  TAKE THESE MEDICATIONS THE MORNING OF SURGERY WITH A SIP OF WATER:  1.  PROZAC  Use CHG Soap as directed on instruction sheet.  ON January 16 - Stop ASPIRIN, Anti-inflammatories such as Advil, Aleve, Ibuprofen, Motrin, Naproxen, Naprosyn, Goodie powder, or aspirin products. (May take Tylenol or Acetaminophen if  needed.)  ON January 16 - Stop ANY OVER THE COUNTER supplements until after surgery. (May continue multivitamin.)  If you are being admitted to the hospital overnight, leave your suitcase in the car. After surgery it may be brought to your room.  Please call the number above if you have any questions about these instructions.

## 2017-09-07 LAB — URINE CULTURE: CULTURE: NO GROWTH

## 2017-09-17 ENCOUNTER — Ambulatory Visit
Admission: RE | Admit: 2017-09-17 | Discharge: 2017-09-17 | Disposition: A | Discharge status: Home / Self Care | Payer: Medicare Other | Source: Ambulatory Visit | Attending: Internal Medicine | Admitting: Internal Medicine

## 2017-09-17 DIAGNOSIS — I700 Atherosclerosis of aorta: Secondary | ICD-10-CM | POA: Diagnosis not present

## 2017-09-17 DIAGNOSIS — I2510 Atherosclerotic heart disease of native coronary artery without angina pectoris: Secondary | ICD-10-CM | POA: Diagnosis not present

## 2017-09-17 DIAGNOSIS — R1032 Left lower quadrant pain: Secondary | ICD-10-CM | POA: Diagnosis present

## 2017-09-17 MED ORDER — IOPAMIDOL (ISOVUE-300) INJECTION 61%
100.0000 mL | Freq: Once | INTRAVENOUS | Status: AC | PRN
  Administered 2017-09-17: 100 mL via INTRAVENOUS

## 2017-10-08 ENCOUNTER — Inpatient Hospital Stay: Payer: Medicare Other

## 2017-10-08 ENCOUNTER — Inpatient Hospital Stay: Payer: Medicare Other | Admitting: Anesthesiology

## 2017-10-08 ENCOUNTER — Inpatient Hospital Stay
Admission: RE | Admit: 2017-10-08 | Discharge: 2017-10-10 | DRG: 462 | Disposition: A | Discharge status: Skilled Nursing Facility | Payer: Medicare Other | Source: Ambulatory Visit | Attending: Orthopedic Surgery | Admitting: Orthopedic Surgery

## 2017-10-08 ENCOUNTER — Encounter: Payer: Self-pay | Admitting: *Deleted

## 2017-10-08 ENCOUNTER — Encounter
Admission: RE | Disposition: A | Discharge status: Skilled Nursing Facility | Payer: Self-pay | Source: Ambulatory Visit | Attending: Orthopedic Surgery

## 2017-10-08 ENCOUNTER — Other Ambulatory Visit: Payer: Self-pay

## 2017-10-08 DIAGNOSIS — Z96642 Presence of left artificial hip joint: Secondary | ICD-10-CM | POA: Diagnosis present

## 2017-10-08 DIAGNOSIS — F17200 Nicotine dependence, unspecified, uncomplicated: Secondary | ICD-10-CM | POA: Diagnosis present

## 2017-10-08 DIAGNOSIS — G8918 Other acute postprocedural pain: Secondary | ICD-10-CM

## 2017-10-08 DIAGNOSIS — Z8249 Family history of ischemic heart disease and other diseases of the circulatory system: Secondary | ICD-10-CM | POA: Diagnosis not present

## 2017-10-08 DIAGNOSIS — Z7982 Long term (current) use of aspirin: Secondary | ICD-10-CM | POA: Diagnosis not present

## 2017-10-08 DIAGNOSIS — Z825 Family history of asthma and other chronic lower respiratory diseases: Secondary | ICD-10-CM | POA: Diagnosis not present

## 2017-10-08 DIAGNOSIS — M8580 Other specified disorders of bone density and structure, unspecified site: Secondary | ICD-10-CM | POA: Diagnosis present

## 2017-10-08 DIAGNOSIS — J431 Panlobular emphysema: Secondary | ICD-10-CM | POA: Diagnosis present

## 2017-10-08 DIAGNOSIS — Z803 Family history of malignant neoplasm of breast: Secondary | ICD-10-CM

## 2017-10-08 DIAGNOSIS — M170 Bilateral primary osteoarthritis of knee: Principal | ICD-10-CM | POA: Diagnosis present

## 2017-10-08 DIAGNOSIS — I10 Essential (primary) hypertension: Secondary | ICD-10-CM | POA: Diagnosis present

## 2017-10-08 DIAGNOSIS — Z91012 Allergy to eggs: Secondary | ICD-10-CM | POA: Diagnosis not present

## 2017-10-08 DIAGNOSIS — Z86010 Personal history of colonic polyps: Secondary | ICD-10-CM | POA: Diagnosis not present

## 2017-10-08 DIAGNOSIS — D62 Acute posthemorrhagic anemia: Secondary | ICD-10-CM | POA: Diagnosis not present

## 2017-10-08 DIAGNOSIS — Z808 Family history of malignant neoplasm of other organs or systems: Secondary | ICD-10-CM | POA: Diagnosis not present

## 2017-10-08 DIAGNOSIS — E785 Hyperlipidemia, unspecified: Secondary | ICD-10-CM | POA: Diagnosis present

## 2017-10-08 DIAGNOSIS — Z79899 Other long term (current) drug therapy: Secondary | ICD-10-CM | POA: Diagnosis not present

## 2017-10-08 HISTORY — PX: TOTAL KNEE ARTHROPLASTY: SHX125

## 2017-10-08 LAB — TYPE AND SCREEN
ABO/RH(D): O POS
Antibody Screen: NEGATIVE

## 2017-10-08 LAB — CBC
HCT: 33.2 % — ABNORMAL LOW (ref 35.0–47.0)
Hemoglobin: 11.3 g/dL — ABNORMAL LOW (ref 12.0–16.0)
MCH: 34.4 pg — ABNORMAL HIGH (ref 26.0–34.0)
MCHC: 34.1 g/dL (ref 32.0–36.0)
MCV: 100.9 fL — ABNORMAL HIGH (ref 80.0–100.0)
PLATELETS: 269 10*3/uL (ref 150–440)
RBC: 3.29 MIL/uL — AB (ref 3.80–5.20)
RDW: 13.6 % (ref 11.5–14.5)
WBC: 10.8 10*3/uL (ref 3.6–11.0)

## 2017-10-08 LAB — CREATININE, SERUM: Creatinine, Ser: 0.73 mg/dL (ref 0.44–1.00)

## 2017-10-08 SURGERY — ARTHROPLASTY, KNEE, BILATERAL, TOTAL
Anesthesia: Spinal | Site: Knee | Laterality: Bilateral | Wound class: Clean

## 2017-10-08 MED ORDER — MORPHINE SULFATE (PF) 10 MG/ML IV SOLN
INTRAVENOUS | Status: DC | PRN
  Administered 2017-10-08 (×2): 10 mg via SURGICAL_CAVITY

## 2017-10-08 MED ORDER — FENTANYL CITRATE (PF) 100 MCG/2ML IJ SOLN
INTRAMUSCULAR | Status: DC | PRN
  Administered 2017-10-08 (×2): 50 ug via INTRAVENOUS

## 2017-10-08 MED ORDER — NEOMYCIN-POLYMYXIN B GU 40-200000 IR SOLN
Status: DC | PRN
  Administered 2017-10-08: 28 mL

## 2017-10-08 MED ORDER — BUPIVACAINE HCL (PF) 0.5 % IJ SOLN
INTRAMUSCULAR | Status: DC | PRN
  Administered 2017-10-08: 2.5 mL via INTRATHECAL

## 2017-10-08 MED ORDER — FENTANYL CITRATE (PF) 100 MCG/2ML IJ SOLN
INTRAMUSCULAR | Status: AC
  Filled 2017-10-08: qty 2

## 2017-10-08 MED ORDER — PHENYLEPHRINE HCL 10 MG/ML IJ SOLN
INTRAMUSCULAR | Status: DC | PRN
  Administered 2017-10-08 (×2): 100 ug via INTRAVENOUS

## 2017-10-08 MED ORDER — CEFAZOLIN SODIUM-DEXTROSE 1-4 GM/50ML-% IV SOLN
INTRAVENOUS | Status: DC | PRN
  Administered 2017-10-08: 1 g via INTRAVENOUS

## 2017-10-08 MED ORDER — MORPHINE SULFATE (PF) 10 MG/ML IV SOLN
INTRAVENOUS | Status: AC
  Filled 2017-10-08: qty 2

## 2017-10-08 MED ORDER — TETRACAINE HCL 1 % IJ SOLN
INTRAMUSCULAR | Status: AC
  Filled 2017-10-08: qty 2

## 2017-10-08 MED ORDER — BUPIVACAINE LIPOSOME 1.3 % IJ SUSP
INTRAMUSCULAR | Status: AC
  Filled 2017-10-08: qty 20

## 2017-10-08 MED ORDER — FENUGREEK 500 MG PO CAPS: ORAL_CAPSULE | Freq: Every day | ORAL | Status: DC

## 2017-10-08 MED ORDER — BOOST / RESOURCE BREEZE PO LIQD CUSTOM: 1.0000 | Freq: Three times a day (TID) | ORAL | Status: DC

## 2017-10-08 MED ORDER — OXYCODONE HCL 5 MG PO TABS: 5.0000 mg | ORAL_TABLET | Freq: Once | ORAL | Status: DC | PRN

## 2017-10-08 MED ORDER — METOCLOPRAMIDE HCL 10 MG PO TABS: 5.0000 mg | ORAL_TABLET | Freq: Three times a day (TID) | ORAL | Status: DC | PRN

## 2017-10-08 MED ORDER — OXYCODONE HCL 5 MG PO TABS: 5.0000 mg | ORAL_TABLET | ORAL | Status: DC | PRN

## 2017-10-08 MED ORDER — PROPOFOL 500 MG/50ML IV EMUL
INTRAVENOUS | Status: AC
  Filled 2017-10-08: qty 50

## 2017-10-08 MED ORDER — CHLORHEXIDINE GLUCONATE 0.12 % MT SOLN: 15.0000 mL | Freq: Two times a day (BID) | OROMUCOSAL | Status: DC | PRN

## 2017-10-08 MED ORDER — OXYCODONE HCL 5 MG PO TABS
10.0000 mg | ORAL_TABLET | ORAL | Status: DC | PRN
  Administered 2017-10-08 – 2017-10-10 (×11): 10 mg via ORAL
  Filled 2017-10-08 (×11): qty 2

## 2017-10-08 MED ORDER — PROPOFOL 500 MG/50ML IV EMUL
INTRAVENOUS | Status: DC | PRN
  Administered 2017-10-08: 50 ug/kg/min via INTRAVENOUS

## 2017-10-08 MED ORDER — PHENOL 1.4 % MT LIQD
1.0000 | OROMUCOSAL | Status: DC | PRN
  Filled 2017-10-08: qty 177

## 2017-10-08 MED ORDER — VITAMIN E 180 MG (400 UNIT) PO CAPS
400.0000 [IU] | ORAL_CAPSULE | Freq: Every day | ORAL | Status: DC
  Administered 2017-10-08 – 2017-10-09 (×2): 400 [IU] via ORAL
  Filled 2017-10-08 (×3): qty 1

## 2017-10-08 MED ORDER — BUPIVACAINE LIPOSOME 1.3 % IJ SUSP
INTRAMUSCULAR | Status: DC | PRN
  Administered 2017-10-08: 60 mL

## 2017-10-08 MED ORDER — ZINC SULFATE 220 (50 ZN) MG PO CAPS
220.0000 mg | ORAL_CAPSULE | Freq: Every day | ORAL | Status: DC
  Administered 2017-10-08 – 2017-10-09 (×2): 220 mg via ORAL
  Filled 2017-10-08 (×3): qty 1

## 2017-10-08 MED ORDER — METOCLOPRAMIDE HCL 5 MG/ML IJ SOLN: 5.0000 mg | Freq: Three times a day (TID) | INTRAMUSCULAR | Status: DC | PRN

## 2017-10-08 MED ORDER — TRANEXAMIC ACID 1000 MG/10ML IV SOLN
INTRAVENOUS | Status: DC | PRN
  Administered 2017-10-08: 1000 mg via INTRAVENOUS

## 2017-10-08 MED ORDER — PROPOFOL 10 MG/ML IV BOLUS
INTRAVENOUS | Status: DC | PRN
  Administered 2017-10-08: 30 mg via INTRAVENOUS

## 2017-10-08 MED ORDER — MIDAZOLAM HCL 2 MG/2ML IJ SOLN
INTRAMUSCULAR | Status: AC
  Filled 2017-10-08: qty 2

## 2017-10-08 MED ORDER — FAMOTIDINE 20 MG PO TABS
ORAL_TABLET | ORAL | Status: AC
  Administered 2017-10-08: 20 mg via ORAL
  Filled 2017-10-08: qty 1

## 2017-10-08 MED ORDER — ATENOLOL 25 MG PO TABS
25.0000 mg | ORAL_TABLET | Freq: Every day | ORAL | Status: DC
  Administered 2017-10-08 – 2017-10-09 (×2): 25 mg via ORAL
  Filled 2017-10-08 (×2): qty 1

## 2017-10-08 MED ORDER — CLOBETASOL PROPIONATE 0.05 % EX CREA
1.0000 "application " | TOPICAL_CREAM | CUTANEOUS | Status: DC | PRN
  Filled 2017-10-08: qty 15

## 2017-10-08 MED ORDER — BUPIVACAINE-EPINEPHRINE (PF) 0.25% -1:200000 IJ SOLN
INTRAMUSCULAR | Status: AC
  Filled 2017-10-08: qty 60

## 2017-10-08 MED ORDER — LIDOCAINE HCL (PF) 2 % IJ SOLN
INTRAMUSCULAR | Status: DC | PRN
  Administered 2017-10-08: 50 mg

## 2017-10-08 MED ORDER — PHENYLEPHRINE HCL 10 MG/ML IJ SOLN
INTRAMUSCULAR | Status: AC
Start: 2017-10-08 — End: 2017-10-08
  Filled 2017-10-08: qty 1

## 2017-10-08 MED ORDER — SODIUM CHLORIDE 0.9 % IV SOLN
INTRAVENOUS | Status: DC
  Administered 2017-10-08 – 2017-10-09 (×2): via INTRAVENOUS

## 2017-10-08 MED ORDER — TRANEXAMIC ACID 1000 MG/10ML IV SOLN
1000.0000 mg | Freq: Once | INTRAVENOUS | Status: AC
  Administered 2017-10-08: 1000 mg via INTRAVENOUS
  Filled 2017-10-08: qty 10

## 2017-10-08 MED ORDER — CO Q 10 100 MG PO CAPS: 1.0000 | ORAL_CAPSULE | Freq: Every day | ORAL | Status: DC

## 2017-10-08 MED ORDER — ASPIRIN 81 MG PO CHEW
81.0000 mg | CHEWABLE_TABLET | Freq: Every day | ORAL | Status: DC
  Administered 2017-10-09 – 2017-10-10 (×2): 81 mg via ORAL
  Filled 2017-10-08 (×2): qty 1

## 2017-10-08 MED ORDER — MIDAZOLAM HCL 5 MG/5ML IJ SOLN
INTRAMUSCULAR | Status: DC | PRN
  Administered 2017-10-08: 2 mg via INTRAVENOUS
  Administered 2017-10-08 (×2): 1 mg via INTRAVENOUS

## 2017-10-08 MED ORDER — ONDANSETRON HCL 4 MG/2ML IJ SOLN
4.0000 mg | Freq: Four times a day (QID) | INTRAMUSCULAR | Status: DC | PRN
  Administered 2017-10-08: 4 mg via INTRAVENOUS
  Filled 2017-10-08: qty 2

## 2017-10-08 MED ORDER — BUPIVACAINE HCL (PF) 0.5 % IJ SOLN
INTRAMUSCULAR | Status: AC
  Filled 2017-10-08: qty 10

## 2017-10-08 MED ORDER — ACETAMINOPHEN 650 MG RE SUPP: 650.0000 mg | RECTAL | Status: DC | PRN

## 2017-10-08 MED ORDER — FENTANYL CITRATE (PF) 100 MCG/2ML IJ SOLN
INTRAMUSCULAR | Status: AC
  Administered 2017-10-08: 50 ug via INTRAVENOUS
  Filled 2017-10-08: qty 2

## 2017-10-08 MED ORDER — ENSURE ENLIVE PO LIQD
237.0000 mL | Freq: Two times a day (BID) | ORAL | Status: DC
  Administered 2017-10-09 – 2017-10-10 (×4): 237 mL via ORAL

## 2017-10-08 MED ORDER — LACTATED RINGERS IV SOLN
INTRAVENOUS | Status: DC
  Administered 2017-10-08 (×2): via INTRAVENOUS

## 2017-10-08 MED ORDER — BUPIVACAINE-EPINEPHRINE (PF) 0.25% -1:200000 IJ SOLN
INTRAMUSCULAR | Status: DC | PRN
  Administered 2017-10-08 (×2): 30 mL

## 2017-10-08 MED ORDER — ADULT MULTIVITAMIN W/MINERALS CH
1.0000 | ORAL_TABLET | Freq: Every day | ORAL | Status: DC
  Administered 2017-10-08 – 2017-10-09 (×2): 1 via ORAL
  Filled 2017-10-08 (×2): qty 1

## 2017-10-08 MED ORDER — SODIUM CHLORIDE 0.9 % IJ SOLN
INTRAMUSCULAR | Status: AC
  Filled 2017-10-08: qty 100

## 2017-10-08 MED ORDER — GINKGO BILOBA 120 MG PO CAPS: 1.0000 | ORAL_CAPSULE | Freq: Every day | ORAL | Status: DC

## 2017-10-08 MED ORDER — FENTANYL CITRATE (PF) 100 MCG/2ML IJ SOLN
INTRAMUSCULAR | Status: AC
  Administered 2017-10-08: 25 ug via INTRAVENOUS
  Filled 2017-10-08: qty 2

## 2017-10-08 MED ORDER — TRIAMTERENE-HCTZ 37.5-25 MG PO TABS
1.0000 | ORAL_TABLET | Freq: Every day | ORAL | Status: DC
  Administered 2017-10-08 – 2017-10-09 (×2): 1 via ORAL
  Filled 2017-10-08 (×3): qty 1

## 2017-10-08 MED ORDER — KETOROLAC TROMETHAMINE 15 MG/ML IJ SOLN
15.0000 mg | Freq: Once | INTRAMUSCULAR | Status: AC
Start: 2017-10-08 — End: 2017-10-08
  Administered 2017-10-08: 15 mg via INTRAVENOUS
  Filled 2017-10-08: qty 1

## 2017-10-08 MED ORDER — MAGNESIUM OXIDE 400 (241.3 MG) MG PO TABS
400.0000 mg | ORAL_TABLET | Freq: Every day | ORAL | Status: DC
  Administered 2017-10-08 – 2017-10-09 (×2): 400 mg via ORAL
  Filled 2017-10-08 (×2): qty 1

## 2017-10-08 MED ORDER — KETOROLAC TROMETHAMINE 15 MG/ML IJ SOLN
15.0000 mg | Freq: Four times a day (QID) | INTRAMUSCULAR | Status: DC | PRN
  Administered 2017-10-09 (×2): 15 mg via INTRAVENOUS
  Filled 2017-10-08 (×2): qty 1

## 2017-10-08 MED ORDER — FAMOTIDINE 20 MG PO TABS
20.0000 mg | ORAL_TABLET | Freq: Once | ORAL | Status: AC
  Administered 2017-10-08: 20 mg via ORAL

## 2017-10-08 MED ORDER — LIDOCAINE HCL (PF) 2 % IJ SOLN
INTRAMUSCULAR | Status: AC
  Filled 2017-10-08: qty 10

## 2017-10-08 MED ORDER — ATORVASTATIN CALCIUM 10 MG PO TABS
10.0000 mg | ORAL_TABLET | Freq: Every day | ORAL | Status: DC
  Administered 2017-10-08 – 2017-10-09 (×2): 10 mg via ORAL
  Filled 2017-10-08 (×2): qty 1

## 2017-10-08 MED ORDER — GLYCOPYRROLATE 0.2 MG/ML IJ SOLN
INTRAMUSCULAR | Status: AC
  Filled 2017-10-08: qty 1

## 2017-10-08 MED ORDER — ENOXAPARIN SODIUM 30 MG/0.3ML ~~LOC~~ SOLN
30.0000 mg | Freq: Two times a day (BID) | SUBCUTANEOUS | Status: DC
Start: 2017-10-09 — End: 2017-10-10
  Administered 2017-10-09 – 2017-10-10 (×3): 30 mg via SUBCUTANEOUS
  Filled 2017-10-08 (×3): qty 0.3

## 2017-10-08 MED ORDER — MEPERIDINE HCL 50 MG/ML IJ SOLN: 6.2500 mg | INTRAMUSCULAR | Status: DC | PRN

## 2017-10-08 MED ORDER — ACETAMINOPHEN 325 MG PO TABS
650.0000 mg | ORAL_TABLET | ORAL | Status: DC | PRN
  Administered 2017-10-10: 650 mg via ORAL
  Filled 2017-10-08: qty 2

## 2017-10-08 MED ORDER — CEFAZOLIN SODIUM-DEXTROSE 2-4 GM/100ML-% IV SOLN
INTRAVENOUS | Status: AC
  Filled 2017-10-08: qty 100

## 2017-10-08 MED ORDER — TETRACAINE HCL 1 % IJ SOLN
INTRAMUSCULAR | Status: DC | PRN
  Administered 2017-10-08: 5 mg via INTRASPINAL

## 2017-10-08 MED ORDER — CEFAZOLIN SODIUM-DEXTROSE 2-4 GM/100ML-% IV SOLN
2.0000 g | Freq: Four times a day (QID) | INTRAVENOUS | Status: AC
  Administered 2017-10-08 – 2017-10-09 (×3): 2 g via INTRAVENOUS
  Filled 2017-10-08 (×4): qty 100

## 2017-10-08 MED ORDER — OXYCODONE HCL 5 MG/5ML PO SOLN: 5.0000 mg | Freq: Once | ORAL | Status: DC | PRN

## 2017-10-08 MED ORDER — TRANEXAMIC ACID 1000 MG/10ML IV SOLN
1000.0000 mg | INTRAVENOUS | Status: DC
  Filled 2017-10-08: qty 10

## 2017-10-08 MED ORDER — HYDROMORPHONE HCL 1 MG/ML IJ SOLN
1.0000 mg | INTRAMUSCULAR | Status: DC | PRN
  Administered 2017-10-08 – 2017-10-09 (×4): 1 mg via INTRAVENOUS
  Filled 2017-10-08 (×4): qty 1

## 2017-10-08 MED ORDER — PROMETHAZINE HCL 25 MG/ML IJ SOLN: 6.2500 mg | INTRAMUSCULAR | Status: DC | PRN

## 2017-10-08 MED ORDER — GLYCOPYRROLATE 0.2 MG/ML IJ SOLN
INTRAMUSCULAR | Status: DC | PRN
  Administered 2017-10-08: 0.2 mg via INTRAVENOUS

## 2017-10-08 MED ORDER — ALUM & MAG HYDROXIDE-SIMETH 200-200-20 MG/5ML PO SUSP: 30.0000 mL | ORAL | Status: DC | PRN

## 2017-10-08 MED ORDER — FENTANYL CITRATE (PF) 100 MCG/2ML IJ SOLN
25.0000 ug | INTRAMUSCULAR | Status: DC | PRN
  Administered 2017-10-08: 25 ug via INTRAVENOUS
  Administered 2017-10-08 (×2): 50 ug via INTRAVENOUS
  Administered 2017-10-08: 25 ug via INTRAVENOUS

## 2017-10-08 MED ORDER — FENTANYL CITRATE (PF) 100 MCG/2ML IJ SOLN
25.0000 ug | INTRAMUSCULAR | Status: AC | PRN
  Administered 2017-10-08 (×2): 25 ug via INTRAVENOUS

## 2017-10-08 MED ORDER — FLUOXETINE HCL 20 MG PO CAPS
20.0000 mg | ORAL_CAPSULE | Freq: Every day | ORAL | Status: DC
  Administered 2017-10-09 – 2017-10-10 (×2): 20 mg via ORAL
  Filled 2017-10-08 (×2): qty 1

## 2017-10-08 MED ORDER — ONDANSETRON HCL 4 MG PO TABS: 4.0000 mg | ORAL_TABLET | Freq: Four times a day (QID) | ORAL | Status: DC | PRN

## 2017-10-08 MED ORDER — CEFAZOLIN SODIUM-DEXTROSE 2-4 GM/100ML-% IV SOLN
2.0000 g | Freq: Once | INTRAVENOUS | Status: AC
  Administered 2017-10-08: 2 g via INTRAVENOUS

## 2017-10-08 MED ORDER — SODIUM CHLORIDE 0.9 % IV SOLN
INTRAVENOUS | Status: DC | PRN
  Administered 2017-10-08: 25 ug/min via INTRAVENOUS

## 2017-10-08 MED ORDER — NEOMYCIN-POLYMYXIN B GU 40-200000 IR SOLN
Status: AC
  Filled 2017-10-08: qty 40

## 2017-10-08 MED ORDER — MENTHOL 3 MG MT LOZG
1.0000 | LOZENGE | OROMUCOSAL | Status: DC | PRN
  Filled 2017-10-08: qty 9

## 2017-10-08 SURGICAL SUPPLY — 82 items
BANDAGE ACE 6X5 VEL STRL LF (GAUZE/BANDAGES/DRESSINGS) ×6 IMPLANT
BANDAGE ELASTIC 4 LF NS (GAUZE/BANDAGES/DRESSINGS) ×3 IMPLANT
BLADE SAW 1 (BLADE) ×6 IMPLANT
BLADE SURG SZ10 CARB STEEL (BLADE) ×6 IMPLANT
BLOCK CUTTING FEMUR 3 LT MED (MISCELLANEOUS) IMPLANT
BLOCK CUTTING FEMUR 4 LT MED (MISCELLANEOUS) IMPLANT
BLOCK CUTTING FEMUR 4 RT (MISCELLANEOUS) IMPLANT
BLOCK CUTTING TIBIAL 4 ME (MISCELLANEOUS) IMPLANT
BNDG COHESIVE 6X5 TAN STRL LF (GAUZE/BANDAGES/DRESSINGS) ×3 IMPLANT
CANISTER SUCT 1200ML W/VALVE (MISCELLANEOUS) ×3 IMPLANT
CANISTER SUCT 3000ML PPV (MISCELLANEOUS) ×6 IMPLANT
CAPT KNEE TOTAL 3 ×6 IMPLANT
CEMENT HV SMART SET (Cement) ×12 IMPLANT
CHLORAPREP W/TINT 26ML (MISCELLANEOUS) ×9 IMPLANT
COOLER POLAR GLACIER W/PUMP (MISCELLANEOUS) ×6 IMPLANT
CUFF TOURN 24 STER (MISCELLANEOUS) ×6 IMPLANT
CUFF TOURN 30 STER DUAL PORT (MISCELLANEOUS) IMPLANT
DRAPE BILAT LIMB 76X120 89291 (MISCELLANEOUS) ×3 IMPLANT
DRAPE EXTREMITY 106X87X128.5 (DRAPES) ×3 IMPLANT
DRAPE IMP U-DRAPE 54X76 (DRAPES) ×3 IMPLANT
DRAPE INCISE IOBAN 66X45 STRL (DRAPES) ×3 IMPLANT
DRAPE SHEET LG 3/4 BI-LAMINATE (DRAPES) ×12 IMPLANT
DRAPE U-SHAPE 47X51 STRL (DRAPES) ×3 IMPLANT
ELECT CAUTERY BLADE 6.4 (BLADE) ×6 IMPLANT
ELECT REM PT RETURN 9FT ADLT (ELECTROSURGICAL) ×3
ELECTRODE REM PT RTRN 9FT ADLT (ELECTROSURGICAL) ×1 IMPLANT
FEMUR BONE MODEL IMPLANT
GAUZE PETRO XEROFOAM 1X8 (MISCELLANEOUS) ×6 IMPLANT
GAUZE SPONGE 4X4 12PLY STRL (GAUZE/BANDAGES/DRESSINGS) ×6 IMPLANT
GLOVE BIOGEL PI IND STRL 7.0 (GLOVE) ×4 IMPLANT
GLOVE BIOGEL PI IND STRL 9 (GLOVE) ×8 IMPLANT
GLOVE BIOGEL PI INDICATOR 7.0 (GLOVE) ×8
GLOVE BIOGEL PI INDICATOR 9 (GLOVE) ×16
GLOVE INDICATOR 8.0 STRL GRN (GLOVE) ×6 IMPLANT
GLOVE SURG ORTHO 8.0 STRL STRW (GLOVE) ×6 IMPLANT
GLOVE SURG SYN 9.0  PF PI (GLOVE) ×16
GLOVE SURG SYN 9.0 PF PI (GLOVE) ×8 IMPLANT
GOWN SRG 2XL LVL 4 RGLN SLV (GOWNS) ×2 IMPLANT
GOWN STRL NON-REIN 2XL LVL4 (GOWNS) ×4
GOWN STRL REUS W/ TWL LRG LVL3 (GOWN DISPOSABLE) ×2 IMPLANT
GOWN STRL REUS W/ TWL XL LVL3 (GOWN DISPOSABLE) ×4 IMPLANT
GOWN STRL REUS W/TWL LRG LVL3 (GOWN DISPOSABLE) ×4
GOWN STRL REUS W/TWL XL LVL3 (GOWN DISPOSABLE) ×8
HANDLE YANKAUER SUCT BULB TIP (MISCELLANEOUS) ×3 IMPLANT
HOLDER FOLEY CATH W/STRAP (MISCELLANEOUS) ×3 IMPLANT
HOOD PEEL AWAY FLYTE STAYCOOL (MISCELLANEOUS) ×12 IMPLANT
IMMBOLIZER KNEE 19 BLUE UNIV (SOFTGOODS) ×6 IMPLANT
KIT TURNOVER KIT A (KITS) ×3 IMPLANT
KNEE MEDACTA TIBIAL/FEMORAL BL (Knees) ×6 IMPLANT
KNIFE SCULPS 14X20 (INSTRUMENTS) ×3 IMPLANT
NDL SAFETY ECLIPSE 18X1.5 (NEEDLE) ×1 IMPLANT
NEEDLE HYPO 18GX1.5 SHARP (NEEDLE) ×2
NEEDLE SPNL 18GX3.5 QUINCKE PK (NEEDLE) ×6 IMPLANT
NEEDLE SPNL 20GX3.5 QUINCKE YW (NEEDLE) ×6 IMPLANT
NS IRRIG 1000ML POUR BTL (IV SOLUTION) ×3 IMPLANT
PACK TOTAL KNEE (MISCELLANEOUS) ×3 IMPLANT
PAD CAST CTTN 4X4 STRL (SOFTGOODS) ×1 IMPLANT
PAD WRAPON POLAR KNEE (MISCELLANEOUS) ×2 IMPLANT
PADDING CAST COTTON 4X4 STRL (SOFTGOODS) ×2
PULSAVAC PLUS IRRIG FAN TIP (DISPOSABLE) ×6
SOL .9 NS 3000ML IRR  AL (IV SOLUTION) ×2
SOL .9 NS 3000ML IRR UROMATIC (IV SOLUTION) ×1 IMPLANT
SPONGE LAP 18X18 5 PK (GAUZE/BANDAGES/DRESSINGS) ×3 IMPLANT
STAPLER SKIN PROX 35W (STAPLE) ×6 IMPLANT
STOCKINETTE IMPERV 14X48 (MISCELLANEOUS) ×3 IMPLANT
STRAP SAFETY 5IN WIDE (MISCELLANEOUS) ×3 IMPLANT
SUCTION FRAZIER HANDLE 10FR (MISCELLANEOUS) ×4
SUCTION TUBE FRAZIER 10FR DISP (MISCELLANEOUS) ×2 IMPLANT
SUT DVC 2 QUILL PDO  T11 36X36 (SUTURE) ×4
SUT DVC 2 QUILL PDO T11 36X36 (SUTURE) ×2 IMPLANT
SUT ETHIBOND NAB CT1 #1 30IN (SUTURE) ×6 IMPLANT
SUT V-LOC 90 ABS DVC 3-0 CL (SUTURE) ×6 IMPLANT
SYR 20CC LL (SYRINGE) ×3 IMPLANT
SYR 3ML 18GX1 1/2 (SYRINGE) ×6 IMPLANT
SYR 50ML LL SCALE MARK (SYRINGE) ×6 IMPLANT
TIBIAL BONE MODEL IMPLANT
TIP FAN IRRIG PULSAVAC PLUS (DISPOSABLE) ×2 IMPLANT
TIP FEMORAL FAN SPRAY W/SHIELD (MISCELLANEOUS) ×3 IMPLANT
TOWEL OR 17X26 4PK STRL BLUE (TOWEL DISPOSABLE) ×3 IMPLANT
TOWER CARTRIDGE SMART MIX (DISPOSABLE) ×6 IMPLANT
TRAY FOLEY W/METER SILVER 16FR (SET/KITS/TRAYS/PACK) ×3 IMPLANT
WRAPON POLAR PAD KNEE (MISCELLANEOUS) ×6

## 2017-10-08 NOTE — Anesthesia Post-op Follow-up Note (Signed)
Anesthesia QCDR form completed.        

## 2017-10-08 NOTE — Op Note (Signed)
10/08/2017  11:01 AM  PATIENT:  Tabitha Miller  72 y.o. female  PRE-OPERATIVE DIAGNOSIS:  PRIMARY OSTEOARTHRITIS OF BOTH KNEES  POST-OPERATIVE DIAGNOSIS:  PRIMARY OSTEOARTHRITIS OF BOTH KNEES  PROCEDURE:  Procedure(s): TOTAL KNEE BILATERAL (Bilateral)  SURGEON: Laurene Footman, MD  ASSISTANTS: Rachelle Hora [facility]  ANESTHESIA:   spinal  EBL:  Total I/O In: 1100 [I.V.:1100] Out: 250 [Urine:200; Blood:50]  BLOOD ADMINISTERED:none  DRAINS: none   LOCAL MEDICATIONS USED:  MARCAINE    and OTHER exparel and morphine  SPECIMEN:  No Specimen  DISPOSITION OF SPECIMEN:  N/A  COUNTS:  YES  TOURNIQUET:   Total Tourniquet Time Documented: Thigh (Left) - 63 minutes Total: Thigh (Left) - 63 minutes  Thigh (Right) - 56 minutes Total: Thigh (Right) - 56 minutes   IMPLANTS: Medacta GMK knee system, right knee right 4 N femur with 3 tibia and 12 mm PS insert short stem and #2 patella all components cemented, left same with left tibia and femur size right narrow and 3 tibia  DICTATION: .Dragon Dictation  patient brought the operating room and after adequate anesthesia was obtained theboth legs were prepped and draped in sterile fashion was turned by the upper thigh. After patient identification and timeout procedures were completed,  left leg was addressed first with tourniquet raised,midline incision was made followed by medial parapatellar arthrotomy. Inspection revealed extensive degenerative changes throughout the knee particularly lateralcompartment there is exposed bone over the entire condyle tibial and femoral bone loss. Is also extensive synovitis which was subsequently excised. Fat pad and PCL and anterior cruciate ligament were excised at this time and proximal tibia MYKNEE cutting block applied with proximal tibia cut carried out, followed by identical procedure for the distal femur using preop my knee cutting guide.. The 4-in-1 cutting block was applied anterior posterior and  chamfer cuts made. The proximal tibia was prepared with removal of posterior horns of menisci. Placement of the3tibia baseplate and proximal tibial preparation with drillingwithproximal preparation for short stem. With the trial baseplate in place [id number] trial was placed and a 12 mm insert gave excellent stability. Distal femoral drill holes were made followed by the trochlear groove cut with thereaming for the trochlear groove along with the PS drill hole These trials were then removed and the patella cut using the patellar cutting guide and measured to a size2.injection of the above local was placed.Bony surfaces were thoroughly irrigated and dried,the tibial component was cemented into place first followed by the placement of the polyethylene component,set screw with torque screwdriver and femoral component with excess cement removed and the knee held in extension patellar button was then clamped into place after the cementhadset the patella did track well, tourniquet then let down and bleeding checked electrocautery. After thorough irrigation of the knee the arthrotomy was repaired heavy Quill suture to close the capsule.3-0 v-locsubcutaneously followed by skin staples and with Xeroform 4 x 4's ABDs and web roll applied. The identical procedure was carried out on the right knee with the same size implants just right side femur and tibia and after this was stressed Polar Care's were applied to both knees followed by web roll and Ace wrap The exparel dose was cut in half and diluted to 60 cc so each knee received half the usual dose of X Burrell and 60 cc of saline   PLAN OF CARE: Admit to inpatient   PATIENT DISPOSITION:  PACU - hemodynamically stable.

## 2017-10-08 NOTE — Anesthesia Preprocedure Evaluation (Signed)
Anesthesia Evaluation  Patient identified by MRN, date of birth, ID band Patient awake    Reviewed: Allergy & Precautions, NPO status , Patient's Chart, lab work & pertinent test results  History of Anesthesia Complications Negative for: history of anesthetic complications  Airway Mallampati: II  TM Distance: >3 FB Neck ROM: Full    Dental no notable dental hx.    Pulmonary neg sleep apnea, COPD, Current Smoker,    breath sounds clear to auscultation- rhonchi (-) wheezing      Cardiovascular Exercise Tolerance: Good (-) hypertension(-) angina(-) CAD, (-) Past MI, (-) Cardiac Stents and (-) CABG  Rhythm:Regular Rate:Normal - Systolic murmurs and - Diastolic murmurs    Neuro/Psych negative neurological ROS  negative psych ROS   GI/Hepatic negative GI ROS, Neg liver ROS,   Endo/Other  negative endocrine ROSneg diabetes  Renal/GU negative Renal ROS     Musculoskeletal  (+) Arthritis ,   Abdominal (+) - obese,   Peds  Hematology negative hematology ROS (+)   Anesthesia Other Findings Past Medical History: No date: Chickenpox No date: COPD (chronic obstructive pulmonary disease) (HCC) No date: Heart palpitations No date: Hyperlipidemia No date: Osteoarthritis (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)     Comment:  fingers, shoulders, knees approx 01/16: Vertigo     Comment:  x1   Reproductive/Obstetrics                             Lab Results  Component Value Date   WBC 7.8 09/05/2017   HGB 13.1 09/05/2017   HCT 38.4 09/05/2017   MCV 101.0 (H) 09/05/2017   PLT 339 09/05/2017    Anesthesia Physical Anesthesia Plan  ASA: II  Anesthesia Plan: Spinal   Post-op Pain Management:    Induction:   PONV Risk Score and Plan: 1 and Propofol infusion  Airway Management Planned: Natural Airway  Additional Equipment:   Intra-op Plan:   Post-operative Plan:   Informed Consent: I  have reviewed the patients History and Physical, chart, labs and discussed the procedure including the risks, benefits and alternatives for the proposed anesthesia with the patient or authorized representative who has indicated his/her understanding and acceptance.   Dental advisory given  Plan Discussed with: CRNA and Anesthesiologist  Anesthesia Plan Comments:         Anesthesia Quick Evaluation

## 2017-10-08 NOTE — NC FL2 (Signed)
[place] LEVEL OF CARE SCREENING TOOL     IDENTIFICATION  Patient Name: Tabitha Miller Birthdate: Jul 19, 1946 Sex: female Admission Date (Current Location): 10/08/2017  [place] and [place] Number:  Engineering geologist and Address:  [facility], [address], [place], [place] [address]      Provider Number: [id number]  Attending Physician Name and Address:  Hessie Knows, MD  Relative Name and Phone Number:       Current Level of Care: Hospital Recommended Level of Care: [place] Prior Approval Number:    Date Approved/Denied:   PASRR Number: [id number])  Discharge Plan: SNF    Current Diagnoses: Patient Active Problem List   Diagnosis Date Noted  . Bilateral primary osteoarthritis of knee 10/08/2017    Orientation RESPIRATION BLADDER Height & Weight     Self, Time, Situation, Place  O2(2L Nasal Cannula) Continent Weight: 133 lb (60.3 kg) Height:  5' 5.5" (166.4 cm)  BEHAVIORAL SYMPTOMS/MOOD NEUROLOGICAL BOWEL NUTRITION STATUS      Continent Diet(Clear Liquid to be advanced)  AMBULATORY STATUS COMMUNICATION OF NEEDS Skin   Extensive Assist Verbally Surgical wounds(Incision both knees)                       Personal Care Assistance Level of Assistance  Feeding, Dressing, Bathing Bathing Assistance: Limited assistance Feeding assistance: Independent Dressing Assistance: Limited assistance     Functional Limitations Info  Sight, Hearing, Speech Sight Info: Adequate Hearing Info: Adequate Speech Info: Adequate    SPECIAL CARE FACTORS FREQUENCY  PT (By licensed PT), OT (By licensed OT)     PT Frequency: (5) OT Frequency: (5)            Contractures      Additional Factors Info  Code Status, Allergies Code Status Info: (Full Code) Allergies Info: (ALBUMEN, EGG, OTHER)           Current Medications (10/08/2017):  This is the current hospital active medication list Current  Facility-Administered Medications  Medication Dose Route Frequency Provider Last Rate Last Dose  . 0.9 %  sodium chloride infusion   Intravenous Continuous Hessie Knows, MD 75 mL/hr at 10/08/17 1250    . acetaminophen (TYLENOL) tablet 650 mg  650 mg Oral Q4H PRN Hessie Knows, MD       Or  . acetaminophen (TYLENOL) suppository 650 mg  650 mg Rectal Q4H PRN Hessie Knows, MD      . alum & mag hydroxide-simeth (MAALOX/MYLANTA) 200-200-20 MG/5ML suspension 30 mL  30 mL Oral Q4H PRN Hessie Knows, MD      . aspirin chewable tablet 81 mg  81 mg Oral Daily Hessie Knows, MD      . atenolol (TENORMIN) tablet 25 mg  25 mg Oral QHS Hessie Knows, MD      . atorvastatin (LIPITOR) tablet 10 mg  10 mg Oral QHS Hessie Knows, MD      . ceFAZolin (ANCEF) IVPB 2g/100 mL premix  2 g Intravenous Q6H Hessie Knows, MD      . Derrill Memo ON 10/09/2017] clobetasol cream (TEMOVATE) 5.46 % 1 application  1 application Topical PRN Hessie Knows, MD      . Co Q 10 CAPS 1 capsule  1 capsule Oral Daily Hessie Knows, MD      . Derrill Memo ON 10/09/2017] enoxaparin (LOVENOX) injection 30 mg  30 mg Subcutaneous Q12H Hessie Knows, MD      . Fenugreek CAPS   Oral  Daily Hessie Knows, MD      . Derrill Memo ON 10/09/2017] FLUoxetine (PROZAC) capsule 20 mg  20 mg Oral Daily Hessie Knows, MD      . Ginkgo Biloba CAPS 120 mg  1 capsule Oral Daily Hessie Knows, MD      . HYDROmorphone (DILAUDID) injection 1 mg  1 mg Intravenous Q2H PRN Hessie Knows, MD      . magnesium oxide (MAG-OX) tablet 400 mg  400 mg Oral QHS Hessie Knows, MD      . menthol-cetylpyridinium (CEPACOL) lozenge 3 mg  1 lozenge Oral PRN Hessie Knows, MD       Or  . phenol (CHLORASEPTIC) mouth spray 1 spray  1 spray Mouth/Throat PRN Hessie Knows, MD      . metoCLOPramide (REGLAN) tablet 5-10 mg  5-10 mg Oral Q8H PRN Hessie Knows, MD       Or  . metoCLOPramide (REGLAN) injection 5-10 mg  5-10 mg Intravenous Q8H PRN Hessie Knows, MD      . multivitamin with minerals  tablet 1 tablet  1 tablet Oral QHS Hessie Knows, MD      . ondansetron [facility]) tablet 4 mg  4 mg Oral Q6H PRN Hessie Knows, MD       Or  . ondansetron [facility]) injection 4 mg  4 mg Intravenous Q6H PRN Hessie Knows, MD      . oxyCODONE (Oxy IR/ROXICODONE) immediate release tablet 10 mg  10 mg Oral Q3H PRN Hessie Knows, MD   10 mg at 10/08/17 1410  . oxyCODONE (Oxy IR/ROXICODONE) immediate release tablet 5 mg  5 mg Oral Q3H PRN Hessie Knows, MD      . triamterene-hydrochlorothiazide (HTXHFSF-42) 37.5-25 MG per tablet 1 tablet  1 tablet Oral QHS Hessie Knows, MD      . vitamin E capsule 400 Units  400 Units Oral QHS Hessie Knows, MD      . zinc sulfate capsule 220 mg  220 mg Oral QHS Hessie Knows, MD         Discharge Medications: Please see discharge summary for a list of discharge medications.  Relevant Imaging Results:  Relevant Lab Results:   Additional Information (SSN: [id number])  [name], Student-Social Work

## 2017-10-08 NOTE — Progress Notes (Signed)
Chaplain met with husband Elta Guadeloupe and was referred to the patient. Patient would like to complete the AD after admission, but has some concerns about components of the LW.

## 2017-10-08 NOTE — Clinical Social Work Note (Addendum)
Clinical Social Work Assessment  Patient Details  Name: Tabitha Miller MRN: [id number] Date of Birth: 1945-11-25  Date of referral:  10/08/17               Reason for consult:  Facility Placement, Discharge Planning                Permission sought to share information with:  Chartered certified accountant granted to share information::  Yes, Verbal Permission Granted  Name::      Tabitha Miller::   Tabitha Miller  Relationship::     Contact Information:     Housing/Transportation Living arrangements for the past 2 months:  [place] of Information:  Patient, Siblings, Spouse Patient Interpreter Needed:  None Criminal Activity/Legal Involvement Pertinent to Current Situation/Hospitalization:  No - Comment as needed Significant Relationships:  Adult Children, Siblings Lives with:  Spouse Do you feel safe going back to the place where you live?  Yes Need for family participation in patient care:  Yes (Comment)  Care giving concerns: Patient lives in [place] with husband Tabitha Miller [phone]).   Social Worker assessment / plan: Holiday representative (CSW) reviewed patient's chart and noted she had bilateral knee surgery today. PT is pending. Social work Theatre manager met with patient, husband Tabitha Miller, and sister Tabitha Miller [phone]) at bedside. Patient was laying down alert and oriented x4. Social work Theatre manager introduced self and explained the role of the Tabitha Miller. Patient was experiencing pain so spouse participated in assessment. Per Tabitha Miller patient lives in [place] and they are in the process of completing HPOA paperwork and should be meeting with a chaplain. Social work Theatre manager explained that PT is pending, the SNF process and that Tabitha Miller would have to approve. Patient and patient's husband verbalized their understanding and is interested in SNF placement. FL2 completed and faxed out. CSW and social work Theatre manager will continue to follow  up and assist.  Employment status:  Retired Nurse, adult PT Recommendations:  Not assessed at this time Information / Referral to community resources:     Patient/Family's Response to care: Patient and patient's husband are interested in SNF placement.  Patient/Family's Understanding of and Emotional Response to Diagnosis, Current Treatment, and Prognosis:  Patient and her family members were pleasant and thanked social work Theatre manager for her assistance.  Emotional Assessment Appearance:  Appears stated age Attitude/Demeanor/Rapport:    Affect (typically observed):  Quiet, Calm, Pleasant Orientation:  Oriented to Self, Oriented to Place, Oriented to  Time, Oriented to Situation Alcohol / Substance use:  Not Applicable Psych involvement (Current and /or in the community):  No (Comment)  Discharge Needs  Concerns to be addressed:  Care Coordination, Discharge Planning Concerns Readmission within the last 30 days:  No Current discharge risk:  Dependent with Mobility Barriers to Discharge:  Continued Medical Work up   Tabitha Miller, Student-Social Work 10/08/2017, 3:14 PM

## 2017-10-08 NOTE — H&P (Signed)
Tabitha Miller, [place] - 10/03/2017 2:15 PM EST Formatting of this note might be different from the original. Chief Complaint  Patient presents with  . Knee Pain  h&P Bil TKA sx 10/08/17   History of Present Illness: Tabitha Miller is a 72 y.o. female who presents to the clinic for H&P for bilateral total knee arthroplasty with Dr. Rudene Christians on 10/08/2017. Patient has bilateral knee osteoarthritis. She has frequent knee effusions, left knee effusions are greater than right. She recently had 194 cc of joint fluid removed from her left knee. Both knees cause significant discomfort, 5 out of 10. Patient has had no improvement with aspirations, cortisone injections and gel injections. Pain interferes with activities of daily living. She has a very hard time ambulating up steps. Knees will occasionally buckle and give way. Patient is seen Dr. Rudene Christians discussed and agreed and consented to bilateral total knee arthroplasty.  Past Medical History: Past Medical History:  Diagnosis Date  . Chickenpox  . COPD (chronic obstructive pulmonary disease) , unspecified (CMS-HCC)  . Fibrocystic breast  . History of palpitations  . Hyperlipidemia, unspecified  . Hypertension  . Osteoarthritis  . Osteopenia  . 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 Seasonal allergies  . 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 APPENDECTOMY  . BREAST EXCISIONAL BIOPSY  . bunionectony Bilateral  . COLONOSCOPY 08/31/1996  Adenomatous Polyp  . COLONOSCOPY [id number], 04/02/2003, 05/24/2000  PH Adenomatous Polyp  . COLONOSCOPY 0418/2012  Sessile Serrated Adenoma  . COLONOSCOPY 11/25/2013  PH Adenomatous Polyps: CBF 11/2018  . COLONOSCOPY 01/01/2017  Adenomatous Polyps: CBF 12/2019  . Left total hip replacement, direct anterior approach Left 04/15/14  . Right shoulder intra-articular injection Right 04/15/14  . toe surgery    Past Family History: Family History  Problem Relation Age of Onset  . Breast cancer Mother  . High blood pressure (Hypertension) Mother  . Hyperlipidemia (Elevated cholesterol) Mother  . Myocardial Infarction (Heart attack) Mother  . Heart failure Mother  . Melanoma Father  . Breast cancer Sister  . Asthma Sister   Medications: Current Outpatient Medications Ordered in Epic  Medication Sig Dispense Refill  . ascorbic acid (VITAMIN C) 500 MG tablet Take 500 mg by mouth once daily.  Marland Kitchen aspirin 81 MG EC tablet Take 81 mg by mouth once daily.  Marland Kitchen atenolol (TENORMIN) 25 MG tablet TAKE 1 TABLET BY MOUTH DAILY 90 tablet 3  . atorvastatin (LIPITOR) 10 MG tablet Take 1 tablet (10 mg total) by mouth once daily. 90 tablet 1  . chlorhexidine (PERIDEX) 0.12 % solution  . clobetasol (CORMAX) 0.05 % external solution APPLY THIN FILM TWICE A DAY UNTIL CLEAR 0  . co-enzyme Q-10, ubiquinone, 100 mg capsule Take 1 capsule by mouth once daily  . fenugreek seed-bl.thistle-anis 230 mg PwPk Take by mouth  . FLUoxetine (PROZAC) 20 MG tablet Take 20 mg by mouth once daily  . garlic (GARLIC OIL) 0,263 mg Cap Take by mouth.  . ginkgo biloba leaf extract 120 mg Cap Take 1 capsule by mouth once daily  . grape seed oil, bulk, 100 % Oil Use.  . green tea leaf extract (GREEN TEA) Cap Take by mouth.  . magnesium oxide (MAG-OX) 400 mg tablet Take 400 mg by mouth once daily.  . multivitamin tablet Take 1 tablet by mouth once daily.  Marland Kitchen triamterene-hydrochlorothiazide (MAXZIDE-25) 37.5-25 mg tablet TAKE 1 TABLET BY MOUTH DAILY 90  tablet 3  . VITAMIN E, DL,TOCOPHERYL ACET, (VITAMIN E, DL, ACETATE, ORAL) Take by mouth.   No current Epic-ordered facility-administered medications on file.   Allergies: Allergies  Allergen Reactions  . Egg White Swelling  . Eggshell Membrane Unknown    Body mass index is 23.33 kg/m.  Review of Systems:  A comprehensive 14 point ROS was performed, reviewed, and the pertinent  orthopaedic findings are documented in the HPI. Physical Exam: General:  Well developed, well nourished, no apparent distress, normal affect, normal gait with no antalgic component. She ambulates with no assistive devices.  HEENT: Head normocephalic, atraumatic, PERRL.   Abdomen: Soft, non tender, non distended, Bowel sounds present.  Heart: Examination of the heart reveals regular, rate, and rhythm. There is no murmur noted on ascultation. There is a normal apical pulse.  Lungs: Lungs are clear to auscultation. There is no wheeze, rhonchi, or crackles. There is normal expansion of bilateral chest walls.  Vitals:  10/03/17 1416  BP: 126/78   Orthopaedic Examination: Right Left   Gait Normal  Alignment She has bilateral valgus deformity, which is not passively correctable in either knee.  Inspection Significant effusion Significant effusion, left knee effusion greater than right  Palpation Knee Normal Normal  Range of Motion Knee 0-120 5-110 degrees  Strength Normal Normal  Meniscus Exam Normal Normal  Ligament Exam Normal Normal  Patella Exam Normal Normal  Reflexes Normal Normal  Neurologic Normal Normal   Imaging Studies: AP, lateral, and sunrise x-rays of the bilateral knees were obtained previously in 08/2016 and personally reviewed today. These show evidence of bone-on-bone arthritis and loose bodies.  Assessment:  ICD-10-CM ICD-9-CM  1. Primary osteoarthritis of left knee M17.12 715.16  2. Primary osteoarthritis of right knee M17.11 715.16    Plan: 1. Risks, benefits, complications of bilateral total knee arthroplasties were discussed with the patient. Patient has agreed and consented to bilateral total knee replacement on 10/08/2017   Electronically signed by Tabitha Gottron, PA at 10/03/2017 2:47 PM EST

## 2017-10-08 NOTE — Anesthesia Procedure Notes (Signed)
Spinal  Patient location during procedure: OR Start time: 10/08/2017 7:20 AM End time: 10/08/2017 7:29 AM Staffing Anesthesiologist: Emmie Niemann, MD Resident/CRNA: Rolla Plate, CRNA Performed: resident/CRNA  Preanesthetic Checklist Completed: patient identified, site marked, surgical consent, pre-op evaluation, timeout performed, IV checked, risks and benefits discussed and monitors and equipment checked Spinal Block Patient position: sitting Prep: ChloraPrep Patient monitoring: heart rate, continuous pulse ox, blood pressure and cardiac monitor Approach: midline Location: L4-5 Injection technique: single-shot Needle Needle type: Introducer and Pencil-Tip  Needle gauge: 24 G Needle length: 9 cm Additional Notes Negative paresthesia. Negative blood return. Positive free-flowing CSF. Expiration date of kit checked and confirmed. Patient tolerated procedure well, without complications.

## 2017-10-08 NOTE — Progress Notes (Signed)
PHARMACIST - PHYSICIAN ORDER COMMUNICATION  CONCERNING: P&T Medication Policy on Herbal Medications  DESCRIPTION:  This patient's order for:  Co-Q 10, fenugreek, and ginkgo biloba  has been noted.  This product(s) is classified as an "herbal" or natural product. Due to a lack of definitive safety studies or FDA approval, nonstandard manufacturing practices, plus the potential risk of unknown drug-drug interactions while on inpatient medications, the Pharmacy and Therapeutics Committee does not permit the use of "herbal" or natural products of this type within [facility].   ACTION TAKEN: The pharmacy department is unable to verify this order at this time. Please reevaluate patient's clinical condition at discharge and address if the herbal or natural product(s) should be resumed at that time.  Lenis Noon, PharmD Clinical Pharmacist 10/08/17 6:00 PM

## 2017-10-08 NOTE — Progress Notes (Signed)
PT Cancellation Note  Patient Details Name: Tabitha Miller MRN: [id number] DOB: 27-May-1946   Cancelled Treatment:    Reason Eval/Treat Not Completed: Patient not medically ready;Pain limiting ability to participate Attempted to see pt x2 this afternoon, both times she was dealing with excruciating pain and feelings of nausea.  Pt not appropriate for POD0 PT eval in current situation.  Kreg Shropshire, DPT 10/08/2017, 4:36 PM

## 2017-10-08 NOTE — Transfer of Care (Signed)
Immediate Anesthesia Transfer of Care Note  Patient: Tabitha Miller  Procedure(s) Performed: TOTAL KNEE BILATERAL (Bilateral Knee)  Patient Location: PACU  Anesthesia Type:Spinal  Level of Consciousness: awake, alert  and oriented  Airway & Oxygen Therapy: Patient Spontanous Breathing  Post-op Assessment: Report given to RN and Post -op Vital signs reviewed and stable  Post vital signs: Reviewed  Last Vitals:  Vitals:   10/08/17 0639 10/08/17 1045  BP: 123/67 99/60  Pulse: 62 71  Resp: 16 14  Temp: 37.1 C   SpO2: 97% 98%    Last Pain:  Vitals:   10/08/17 0639  TempSrc: Oral      Patients Stated Pain Goal: 0 (25/05/39 7673)  Complications: No apparent anesthesia complications

## 2017-10-08 NOTE — Clinical Social Work Placement (Signed)
   CLINICAL SOCIAL WORK PLACEMENT  NOTE  Date:  10/08/2017  Patient Details  Name: Tabitha Miller MRN: [id number] Date of Birth: 09-23-1945  Clinical Social Work is seeking post-discharge placement for this patient at the [place] level of care (*CSW will initial, date and re-position this form in  chart as items are completed):  Yes   Patient/family provided with [facility] Work Department's list of facilities offering this level of care within the geographic area requested by the patient (or if unable, by the patient's family).  Yes   Patient/family informed of their freedom to choose among providers that offer the needed level of care, that participate in Medicare, Medicaid or managed care program needed by the patient, have an available bed and are willing to accept the patient.  Yes   Patient/family informed of [facility]'s ownership interest in [facility] and [facility], as well as of the fact that they are under no obligation to receive care at these facilities.  PASRR submitted to EDS on       PASRR number received on       Existing PASRR number confirmed on 10/08/17     FL2 transmitted to all facilities in geographic area requested by pt/family on 10/08/17     FL2 transmitted to all facilities within larger geographic area on       Patient informed that his/her managed care company has contracts with or will negotiate with certain facilities, including the following:            Patient/family informed of bed offers received.  Patient chooses bed at       Physician recommends and patient chooses bed at      Patient to be transferred to   on  .  Patient to be transferred to facility by       Patient family notified on   of transfer.  Name of family member notified:        PHYSICIAN       Additional Comment:    _______________________________________________ Cooper Stamp, Veronia Beets, LCSW 10/08/2017, 4:59 PM

## 2017-10-09 ENCOUNTER — Encounter: Payer: Self-pay | Admitting: Orthopedic Surgery

## 2017-10-09 LAB — CBC
HEMATOCRIT: 31.2 % — AB (ref 35.0–47.0)
Hemoglobin: 10.6 g/dL — ABNORMAL LOW (ref 12.0–16.0)
MCH: 34.7 pg — ABNORMAL HIGH (ref 26.0–34.0)
MCHC: 33.9 g/dL (ref 32.0–36.0)
MCV: 102.4 fL — AB (ref 80.0–100.0)
PLATELETS: 253 10*3/uL (ref 150–440)
RBC: 3.04 MIL/uL — ABNORMAL LOW (ref 3.80–5.20)
RDW: 13.7 % (ref 11.5–14.5)
WBC: 8.2 10*3/uL (ref 3.6–11.0)

## 2017-10-09 LAB — BASIC METABOLIC PANEL
ANION GAP: 8 (ref 5–15)
BUN: 15 mg/dL (ref 6–20)
CO2: 26 mmol/L (ref 22–32)
Calcium: 8.5 mg/dL — ABNORMAL LOW (ref 8.9–10.3)
Chloride: 98 mmol/L — ABNORMAL LOW (ref 101–111)
Creatinine, Ser: 0.77 mg/dL (ref 0.44–1.00)
GFR calc Af Amer: >60 mL/min (ref >60)
GFR calc non Af Amer: >60 mL/min (ref >60)
Glucose, Bld: 116 mg/dL — ABNORMAL HIGH (ref 65–99)
Potassium: 3.4 mmol/L — ABNORMAL LOW (ref 3.5–5.1)
Sodium: 132 mmol/L — ABNORMAL LOW (ref 135–145)

## 2017-10-09 NOTE — Progress Notes (Signed)
Physical Therapy Evaluation Patient Details Name: CELENIA HRUSKA MRN: [id number] DOB: 1946-06-11 Today's Date: 10/09/2017   History of Present Illness  Pt underwent bilateral TKA with significant post-op pain which prevented her from participating with therapy on POD#0. She is fully medicated this AM and agrees to workw ith therapy. PMH includes CAD, COPD (not O2 dependent), OA, and vertigo  Clinical Impression  Pt admitted with above diagnosis. Pt currently with functional limitations due to the deficits listed below (see PT Problem List).  Pt requires cues for sequencing, HOB elevated and use of bed rails for bed mobility. MinA+1 to move legs toward EOB and scoot forward once upright. Pt is steady and stable in sitting. ModA+1 required for transfer however +2 present to assist with safety. Cues for hand placement and sequencing. Once upright pt demonstrates heavy UE support on rolling walker for standing and for ambulation. Cues to lead with LLE as pt reports that typically her RLE is the strongest. Pt is able to take short shuffling steps from bed to recliner with rolling walker. Reports increase in pain and is unable to ambulate farther at this time. After ambulation pt is able to participate with bilateral LE exercises. Recommend CIR placement at discharge. Pt is motivated to improve and has good support from her husband. She appears to be able to tolerate 3 hours of combined PT/OT. Pt will benefit from PT services to address deficits in strength, balance, and mobility in order to return to full function at home.     Follow Up Recommendations CIR;Other (comment)(If denied from CIR please arrange SNF)    Equipment Recommendations  None recommended by PT    Recommendations for Other Services       Precautions / Restrictions Precautions Precautions: Fall;Knee Restrictions Weight Bearing Restrictions: Yes RLE Weight Bearing: Weight bearing as tolerated LLE Weight Bearing: Weight bearing as  tolerated      Mobility  Bed Mobility Overal bed mobility: Needs Assistance Bed Mobility: Supine to Sit     Supine to sit: Min assist     General bed mobility comments: Pt requires cues for sequencing, HOB elevated and use of bed rails. MinA+1 to move legs toward EOB and scoot forward once upright. Pt is steady and stable in sitting  Transfers Overall transfer level: Needs assistance Equipment used: Rolling walker (2 wheeled) Transfers: Sit to/from Stand Sit to Stand: Mod assist         General transfer comment: +2 present to assist with transfers for safety. Cues for hand placement and sequencing. Once upright pt demonstrates good UE support on rolling walker.  Ambulation/Gait Ambulation/Gait assistance: Mod assist Ambulation Distance (Feet): 3 Feet Assistive device: Rolling walker (2 wheeled) Gait Pattern/deviations: Decreased step length - right;Decreased step length - left Gait velocity: Decreased Gait velocity interpretation: <1.8 ft/sec, indicative of risk for recurrent falls General Gait Details: Pt with heavy UE support on rolling walker for ambulation. Cues to lead with LLE as pt reports that typically her RLE is the strongest. Pt is able to take short shuffling steps from bed to recliner with rolling walker. Reports increase in pain and is unable to ambulate farther at this time.   Stairs            Wheelchair Mobility    Modified Rankin (Stroke Patients Only)       Balance Overall balance assessment: Needs assistance Sitting-balance support: No upper extremity supported Sitting balance-Leahy Scale: Good     Standing balance support: Bilateral upper extremity supported  Standing balance-Leahy Scale: Fair Standing balance comment: Able to balance with bilateral UE support on walker                             Pertinent Vitals/Pain Pain Assessment: 0-10 Pain Score: 0-No pain Pain Location: Bilateral knees, during first attempt pt  reports 10+/10 pain. RN comes and gives pt IV toradol and upon return 45 minutes later pt reports 0/10 pain at rest. Pain increases with mobility Pain Intervention(s): Monitored during session;Premedicated before session    [place] expects to be discharged to:: Private residence Living Arrangements: Spouse/significant other Available Help at Discharge: Family Type of Home: House Home Access: Stairs to enter Entrance Stairs-Rails: Right(Wall on the left) Entrance Stairs-Number of Steps: 6 Home Layout: Two level;Able to live on main level with bedroom/bathroom Home Equipment: Gilford Rile - 2 wheels;Walker - 4 wheels;Bedside commode;Cane - single point;Shower seat      Prior Function Level of Independence: Independent         Comments: Independent with ADLs/IADLs. No assistive device for ambulation.      Hand Dominance        Extremity/Trunk Assessment   Upper Extremity Assessment Upper Extremity Assessment: Overall WFL for tasks assessed    Lower Extremity Assessment Lower Extremity Assessment: RLE deficits/detail RLE Deficits / Details: Pt requires 2 finger assist for SLR bilaterally. Able to perform SAQ without assistance. Full DF/PF bilaterally. Reports intact sensation to light touch       Communication   Communication: No difficulties  Cognition Arousal/Alertness: Awake/alert Behavior During Therapy: Anxious Overall Cognitive Status: Within Functional Limits for tasks assessed                                 General Comments: Pt initially very anxious regarding mobility with therapy but improves when pain is under better control      General Comments      Exercises Total Joint Exercises Ankle Circles/Pumps: Both;10 reps Quad Sets: Both;10 reps Gluteal Sets: Both;10 reps Towel Squeeze: Both;10 reps Short Arc Quad: Both;10 reps Hip ABduction/ADduction: Both;10 reps Straight Leg Raises: Both;10 reps Goniometric ROM: R: -14 to  87 AAROM, L: -12-81 AAROM   Assessment/Plan    PT Assessment Patient needs continued PT services  PT Problem List Decreased strength;Decreased range of motion;Decreased balance;Decreased mobility;Decreased knowledge of use of DME;Pain       PT Treatment Interventions DME instruction;Gait training;Stair training;Functional mobility training;Therapeutic activities;Therapeutic exercise;Balance training;Neuromuscular re-education;Patient/family education;Manual techniques    PT Goals (Current goals can be found in the Care Plan section)  Acute Rehab PT Goals Patient Stated Goal: Return to prior function at home PT Goal Formulation: With patient Time For Goal Achievement: 10/23/17 Potential to Achieve Goals: Good    Frequency BID   Barriers to discharge        Co-evaluation               AM-PAC PT "6 Clicks" Daily Activity  Outcome Measure Difficulty turning over in bed (including adjusting bedclothes, sheets and blankets)?: Unable Difficulty moving from lying on back to sitting on the side of the bed? : Unable Difficulty sitting down on and standing up from a chair with arms (e.g., wheelchair, bedside commode, etc,.)?: Unable Help needed moving to and from a bed to chair (including a wheelchair)?: A Lot Help needed walking in hospital room?: A Lot Help needed climbing  3-5 steps with a railing? : Total 6 Click Score: 8    End of Session Equipment Utilized During Treatment: Gait belt Activity Tolerance: Patient tolerated treatment well Patient left: in chair;with call bell/phone within reach;with chair alarm set;with SCD's reapplied;Other (comment)(towel roll under heel, polar care in place) Nurse Communication: Mobility status PT Visit Diagnosis: Unsteadiness on feet (R26.81);Muscle weakness (generalized) (M62.81);Difficulty in walking, not elsewhere classified (R26.2);Pain Pain - Right/Left: (Bilateral) Pain - part of body: Knee    Time: 7619-5093 PT Time Calculation  (min) (ACUTE ONLY): 48 min   Charges:   PT Evaluation $PT Eval Moderate Complexity: 1 Mod PT Treatments $Therapeutic Exercise: 8-22 mins   PT G Codes:        Lyndel Safe Mackenna Kamer PT, DPT    Vaunda Gutterman 10/09/2017, 11:39 AM

## 2017-10-09 NOTE — Progress Notes (Signed)
[facility] inpatient rehab admissions - I received a request from PT to screen patient.  Noted patient would like to go to a SNF in [place].  That is a good option.  Likely [employer] medicare would not approve an acute inpatient rehab admission for current diagnosis.  Call me for questions.  #[phone]

## 2017-10-09 NOTE — Progress Notes (Signed)
   Subjective: 1 Day Post-Op Procedure(s) (LRB): TOTAL KNEE BILATERAL (Bilateral) Patient reports pain as severe.  Improved from yesterday Patient is well, and has had no acute complaints or problems Denies any CP, SOB, ABD pain. We will start therapy today.    Objective: Vital signs in last 24 hours: Temp:  [97.3 F (36.3 C)-98.7 F (37.1 C)] 98.5 F (36.9 C) (02/13 0800) Pulse Rate:  [52-73] 57 (02/13 0800) Resp:  [10-20] 18 (02/13 0800) BP: (99-145)/(52-74) 110/56 (02/13 0800) SpO2:  [94 %-100 %] 96 % (02/13 0800)  Intake/Output from previous day: 02/12 0701 - 02/13 0700 In: 3990 [P.O.:360; I.V.:3330; IV Piggyback:300] Out: 815 [Urine:765; Blood:50] Intake/Output this shift: No intake/output data recorded.  Recent Labs    10/08/17 1410  HGB 11.3*   Recent Labs    10/08/17 1410  WBC 10.8  RBC 3.29*  HCT 33.2*  PLT 269   Recent Labs    10/08/17 1410  CREATININE 0.73   No results for input(s): LABPT, INR in the last 72 hours.  EXAM General - Patient is Alert, Appropriate and Oriented Extremity - Neurovascular intact Sensation intact distally Intact pulses distally Dorsiflexion/Plantar flexion intact No cellulitis present Compartment soft Dressing - dressing C/D/I and no drainage Motor Function - intact, moving foot and toes well on exam.   Past Medical History:  Diagnosis Date  . Chickenpox   . COPD (chronic obstructive pulmonary disease) ([place])   . Heart palpitations   . Hyperlipidemia   . Osteoarthritis (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)    fingers, shoulders, knees  . Vertigo approx 01/16   x1    Assessment/Plan:   1 Day Post-Op Procedure(s) (LRB): TOTAL KNEE BILATERAL (Bilateral) Active Problems:   Bilateral primary osteoarthritis of knee  Estimated body mass index is 21.8 kg/m as calculated from the following:   Height as of this encounter: 5' 5.5" (1.664 m).   Weight as of this encounter: 60.3 kg (133 lb). Advance diet Up  with therapy  Needs BM Recheck labs in the am Check BMP CM to assist with discharge    DVT Prophylaxis - Lovenox, Foot Pumps and TED hose Weight-Bearing as tolerated to Left and right legs   T. Rachelle Hora, PA-C [place] 10/09/2017, 8:15 AM

## 2017-10-09 NOTE — Progress Notes (Signed)
PT is recommending CIR. Clinical Education officer, museum (CSW) met with patient and her husband at bedside and made them aware of above. Patient and husband prefer to go to a SNF in [facility] and do not want to go to [place]. CSW presented bed offers and patient chose a private room at [facility]. Per [facility] admissions coordinator at [facility] they can offer and private room. Per [place] SNF authorization has been received. Patient can D/C to [facility] when stable.   [employer], LCSW [phone]

## 2017-10-09 NOTE — Anesthesia Postprocedure Evaluation (Signed)
Anesthesia Post Note  Patient: Tabitha Miller  Procedure(s) Performed: TOTAL KNEE BILATERAL (Bilateral Knee)  Patient location during evaluation: Nursing Unit Anesthesia Type: Spinal Level of consciousness: awake and alert and oriented Pain management: satisfactory to patient Vital Signs Assessment: post-procedure vital signs reviewed and stable Respiratory status: respiratory function stable Cardiovascular status: stable Postop Assessment: no headache, no backache, no apparent nausea or vomiting, patient able to bend at knees, spinal receding and adequate PO intake Anesthetic complications: no     Last Vitals:  Vitals:   10/08/17 1959 10/09/17 0341  BP: 121/65 (!) 100/52  Pulse: 63 66  Resp: 18 18  Temp: (!) 36.3 C 36.9 C  SpO2: 96% 99%    Last Pain:  Vitals:   10/09/17 0628  TempSrc:   PainSc: 7                  Blima Singer

## 2017-10-09 NOTE — Progress Notes (Signed)
Physical Therapy Treatment Patient Details Name: Tabitha Miller MRN: [id number] DOB: 28-Dec-1945 Today's Date: 10/09/2017    History of Present Illness Pt underwent bilateral TKA with significant post-op pain which prevented her from participating with therapy on POD#0. She is fully medicated this AM and agrees to workw ith therapy. PMH includes CAD, COPD (not O2 dependent), OA, and vertigo    PT Comments    Pt initially resistant to working with therapy because she is having a lot of pain and waiting to pass urine. However with encouragement from therapy she agrees to perform bed exercises. She is able to complete all bed exercises as instructed and then agrees to practice transfers to [facility]. Once upon [facility] she is able to urinate and then transfer back to the bed. She continues to require modA+1 to assist with transfers and very limited ambulation. Will continue to progress ambulation and exercise at next PT session. Pt will benefit from PT services to address deficits in strength, balance, and mobility in order to return to full function at home.     Follow Up Recommendations  SNF(Pt refusing CIR)     Equipment Recommendations  None recommended by PT    Recommendations for Other Services       Precautions / Restrictions Precautions Precautions: Fall;Knee Restrictions Weight Bearing Restrictions: Yes RLE Weight Bearing: Weight bearing as tolerated LLE Weight Bearing: Weight bearing as tolerated    Mobility  Bed Mobility Overal bed mobility: Needs Assistance Bed Mobility: Supine to Sit     Supine to sit: Min assist     General bed mobility comments: Pt requires assist for bilateral LE's when moving to EOB and when laying down  Transfers Overall transfer level: Needs assistance Equipment used: Rolling walker (2 wheeled) Transfers: Sit to/from Stand Sit to Stand: Mod assist         General transfer comment: Pt performs standing pivot transfers to and from [facility]. Cues for  safe hand placement and sequencing  Ambulation/Gait Ambulation/Gait assistance: Mod assist Ambulation Distance (Feet): 3 Feet Assistive device: Rolling walker (2 wheeled) Gait Pattern/deviations: Decreased step length - right;Decreased step length - left Gait velocity: Decreased Gait velocity interpretation: <1.8 ft/sec, indicative of risk for recurrent falls General Gait Details: Pt provided cues for short ambulation from bed to [facility]. She takes short, shuffling steps with increase in bilateral LE pain.    Stairs            Wheelchair Mobility    Modified Rankin (Stroke Patients Only)       Balance Overall balance assessment: Needs assistance Sitting-balance support: No upper extremity supported Sitting balance-Leahy Scale: Good     Standing balance support: Bilateral upper extremity supported Standing balance-Leahy Scale: Fair Standing balance comment: Able to balance with bilateral UE support on walker                            Cognition Arousal/Alertness: Awake/alert Behavior During Therapy: WFL for tasks assessed/performed Overall Cognitive Status: Within Functional Limits for tasks assessed                                        Exercises Total Joint Exercises Ankle Circles/Pumps: Both;15 reps Quad Sets: Both;15 reps Gluteal Sets: Both;15 reps Towel Squeeze: Both;15 reps Short Arc Quad: Both;15 reps Hip ABduction/ADduction: Both;15 reps Straight Leg Raises: Both;15 reps Other Exercises  Other Exercises: pt instructed in falls prevention strategies to minimize falls risk Other Exercises: pt instructed in polar care mgt    General Comments        Pertinent Vitals/Pain Pain Assessment: Faces Pain Score: 8  Faces Pain Scale: Hurts even more Pain Location: bilat knees, refused to attempt to move BLE due to pain Pain Intervention(s): Monitored during session;Premedicated before session    [place] expects  to be discharged to:: Private residence Living Arrangements: Spouse/significant other Available Help at Discharge: Family Type of Home: House Home Access: Stairs to enter Entrance Stairs-Rails: Right(wall on left) Home Layout: Two level;Able to live on main level with bedroom/bathroom Home Equipment: Gilford Rile - 2 wheels;Walker - 4 wheels;Bedside commode;Cane - single point;Shower seat;Adaptive equipment;Hand held shower head      Prior Function Level of Independence: Independent      Comments: Independent with ADLs/IADLs. No assistive device for ambulation.    PT Goals (current goals can now be found in the care plan section) Acute Rehab PT Goals Patient Stated Goal: Return to prior function at home PT Goal Formulation: With patient Time For Goal Achievement: 10/23/17 Potential to Achieve Goals: Good Progress towards PT goals: Progressing toward goals    Frequency    BID      PT Plan Current plan remains appropriate    Co-evaluation              AM-PAC PT "6 Clicks" Daily Activity  Outcome Measure  Difficulty turning over in bed (including adjusting bedclothes, sheets and blankets)?: Unable Difficulty moving from lying on back to sitting on the side of the bed? : Unable Difficulty sitting down on and standing up from a chair with arms (e.g., wheelchair, bedside commode, etc,.)?: Unable Help needed moving to and from a bed to chair (including a wheelchair)?: A Lot Help needed walking in hospital room?: A Lot Help needed climbing 3-5 steps with a railing? : Total 6 Click Score: 8    End of Session Equipment Utilized During Treatment: Gait belt Activity Tolerance: Patient tolerated treatment well Patient left: with call bell/phone within reach;with SCD's reapplied;Other (comment);in bed;with bed alarm set(towel roll under heel, polar care in place) Nurse Communication: Mobility status;Other (comment)(RN in room to observe) PT Visit Diagnosis: Unsteadiness on feet  (R26.81);Muscle weakness (generalized) (M62.81);Difficulty in walking, not elsewhere classified (R26.2);Pain Pain - Right/Left: (Bilateral) Pain - part of body: Knee     Time: 1445-1515 PT Time Calculation (min) (ACUTE ONLY): 30 min  Charges:  $Therapeutic Exercise: 8-22 mins $Therapeutic Activity: 8-22 mins                    G Codes:       Lyndel Safe Huprich PT, DPT     Huprich,Jason 10/09/2017, 3:35 PM

## 2017-10-09 NOTE — Progress Notes (Signed)
Patient continuously asking for a pillow under knees. Education provided. Patient refuses bone foam and towel rolls for her ankles.  Some numbness on R hip verbalized by patient. MD Rudene Christians paged and made aware.

## 2017-10-09 NOTE — Care Management (Signed)
Patient refusing CIR but requesting [place]. RNCM will follow along with CSW.

## 2017-10-09 NOTE — Evaluation (Signed)
Occupational Therapy Evaluation Patient Details Name: Tabitha Miller MRN: [id number] DOB: [birth year] Today's Date: 10/09/2017    History of Present Illness Pt underwent bilateral TKA with significant post-op pain which prevented her from participating with therapy on POD#0. She is fully medicated this AM and agrees to workw ith therapy. PMH includes CAD, COPD (not O2 dependent), OA, and vertigo   Clinical Impression   Pt seen for OT evaluation this date. Pt is 72 year old female POD#1 s/p R/L TKRs.  Pt was independent in all ADLs prior to surgery and is eager to return to PLOF.  Pt currently requires moderate assist for LB dressing and bathing while in seated position due to significant pain and limited AROM of bilateral knees. Pt instructed in polar care mgt and falls prevention strategies. Pt verbalizes understanding of AE for LB ADL tasks but declines to demonstrate understanding due to significant bilat knee pain. Pt would benefit from instruction in dressing techniques with or without assistive devices for dressing and bathing skills.  Pt would also benefit from recommendations for home modifications to increase safety in the bathroom and prevent falls. Recommend transition to STR following hospitalization to maximize return to PLOF.      Follow Up Recommendations  SNF    Equipment Recommendations  None recommended by OT    Recommendations for Other Services       Precautions / Restrictions Precautions Precautions: Fall;Knee Restrictions Weight Bearing Restrictions: Yes RLE Weight Bearing: Weight bearing as tolerated LLE Weight Bearing: Weight bearing as tolerated      Mobility Bed Mobility               General bed mobility comments: pt declined bed mobility this date due to bilat knee pain stating, "I have already done enough today."  Transfers                 General transfer comment: pt declined due to pain    Balance                                            ADL either performed or assessed with clinical judgement   ADL Overall ADL's : Needs assistance/impaired Eating/Feeding: Sitting;Independent   Grooming: Sitting;Independent   Upper Body Bathing: Sitting;Supervision/ safety   Lower Body Bathing: Sitting/lateral leans;Moderate assistance   Upper Body Dressing : Sitting;Supervision/safety   Lower Body Dressing: Sit to/from stand;Moderate assistance Lower Body Dressing Details (indicate cue type and reason): pt verbalizes already knowing how to use AE for LB dressing tasks, verbalizes she already knows how to doff/don TED hose                     Vision Baseline Vision/History: Wears glasses Wears Glasses: At all times Patient Visual Report: No change from baseline       Perception     Praxis      Pertinent Vitals/Pain Pain Assessment: 0-10 Pain Score: 8  Pain Location: bilat knees, refused to attempt to move BLE due to pain Pain Intervention(s): Limited activity within patient's tolerance;Monitored during session;Premedicated before session;Ice applied     Hand Dominance Right   Extremity/Trunk Assessment Upper Extremity Assessment Upper Extremity Assessment: Overall WFL for tasks assessed   Lower Extremity Assessment Lower Extremity Assessment: Defer to PT evaluation       Communication Communication Communication: No difficulties   Cognition Arousal/Alertness:  Awake/alert Behavior During Therapy: WFL for tasks assessed/performed Overall Cognitive Status: Within Functional Limits for tasks assessed                                     General Comments       Exercises Other Exercises Other Exercises: pt instructed in falls prevention strategies to minimize falls risk Other Exercises: pt instructed in polar care mgt   Shoulder Instructions      Home Living Family/patient expects to be discharged to:: Private residence Living Arrangements: Spouse/significant  other Available Help at Discharge: Family Type of Home: House Home Access: Stairs to enter [employer] of Steps: 6 Entrance Stairs-Rails: Right(wall on left) Home Layout: Two level;Able to live on main level with bedroom/bathroom     Bathroom Shower/Tub: Occupational psychologist: Standard     Home Equipment: Environmental consultant - 2 wheels;Walker - 4 wheels;Bedside commode;Cane - single point;Shower seat;Adaptive equipment;Hand held shower head Adaptive Equipment: Reacher;Sock aid;Long-handled shoe horn        Prior Functioning/Environment Level of Independence: Independent        Comments: Independent with ADLs/IADLs. No assistive device for ambulation.         OT Problem List: Decreased strength;Decreased safety awareness;Impaired balance (sitting and/or standing);Pain;Decreased range of motion      OT Treatment/Interventions: Self-care/ADL training;Balance training;Therapeutic exercise;Therapeutic activities;DME and/or AE instruction;Patient/family education    OT Goals(Current goals can be found in the care plan section) Acute Rehab OT Goals Patient Stated Goal: Return to prior function at home OT Goal Formulation: With patient Time For Goal Achievement: 10/23/17 Potential to Achieve Goals: Good  OT Frequency: Min 2X/week   Barriers to D/C: Inaccessible home environment          Co-evaluation              AM-PAC PT "6 Clicks" Daily Activity     Outcome Measure Help from another person eating meals?: None Help from another person taking care of personal grooming?: None Help from another person toileting, which includes using toliet, bedpan, or urinal?: A Little Help from another person bathing (including washing, rinsing, drying)?: A Lot Help from another person to put on and taking off regular upper body clothing?: A Little Help from another person to put on and taking off regular lower body clothing?: A Lot 6 Click Score: 18   End of Session     Activity Tolerance: Patient limited by pain Patient left: in bed;with call bell/phone within reach;with bed alarm set  OT Visit Diagnosis: Other abnormalities of gait and mobility (R26.89);Pain Pain - Right/Left: Right Pain - part of body: Knee(both knees)                Time: 1334-1401 OT Time Calculation (min): 27 min Charges:  OT General Charges $OT Visit: 1 Visit OT Evaluation $OT Eval Low Complexity: 1 Low OT Treatments $Self Care/Home Management : 8-22 mins  Jeni Salles, MPH, MS, OTR/L ascom [phone] 10/09/17, 3:03 PM

## 2017-10-10 LAB — CBC
HEMATOCRIT: 29.5 % — AB (ref 35.0–47.0)
Hemoglobin: 10 g/dL — ABNORMAL LOW (ref 12.0–16.0)
MCH: 34.7 pg — ABNORMAL HIGH (ref 26.0–34.0)
MCHC: 34.1 g/dL (ref 32.0–36.0)
MCV: 102 fL — ABNORMAL HIGH (ref 80.0–100.0)
PLATELETS: 240 10*3/uL (ref 150–440)
RBC: 2.89 MIL/uL — ABNORMAL LOW (ref 3.80–5.20)
RDW: 13.4 % (ref 11.5–14.5)
WBC: 9 10*3/uL (ref 3.6–11.0)

## 2017-10-10 LAB — BASIC METABOLIC PANEL
Anion gap: 10 (ref 5–15)
BUN: 15 mg/dL (ref 6–20)
CALCIUM: 8.8 mg/dL — AB (ref 8.9–10.3)
CO2: 25 mmol/L (ref 22–32)
CREATININE: 0.81 mg/dL (ref 0.44–1.00)
Chloride: 98 mmol/L — ABNORMAL LOW (ref 101–111)
GFR calc Af Amer: >60 mL/min (ref >60)
Glucose, Bld: 106 mg/dL — ABNORMAL HIGH (ref 65–99)
Potassium: 3.5 mmol/L (ref 3.5–5.1)
Sodium: 133 mmol/L — ABNORMAL LOW (ref 135–145)

## 2017-10-10 MED ORDER — BISACODYL 10 MG RE SUPP
10.0000 mg | Freq: Every day | RECTAL | Status: DC | PRN
  Filled 2017-10-10: qty 1

## 2017-10-10 MED ORDER — DOCUSATE SODIUM 100 MG PO CAPS: 100.0000 mg | ORAL_CAPSULE | Freq: Every day | ORAL | 0 refills | Status: DC

## 2017-10-10 MED ORDER — MAGNESIUM HYDROXIDE 400 MG/5ML PO SUSP
30.0000 mL | Freq: Every day | ORAL | Status: DC
  Administered 2017-10-10: 30 mL via ORAL
  Filled 2017-10-10: qty 30

## 2017-10-10 MED ORDER — FE FUMARATE-B12-VIT C-FA-IFC PO CAPS
1.0000 | ORAL_CAPSULE | Freq: Three times a day (TID) | ORAL | Status: DC
  Administered 2017-10-10 (×2): 1 via ORAL
  Filled 2017-10-10 (×3): qty 1

## 2017-10-10 MED ORDER — ENOXAPARIN SODIUM 40 MG/0.4ML ~~LOC~~ SOLN: 40.0000 mg | SUBCUTANEOUS | 0 refills | Status: DC

## 2017-10-10 MED ORDER — DOCUSATE SODIUM 100 MG PO CAPS
100.0000 mg | ORAL_CAPSULE | Freq: Every day | ORAL | Status: DC
  Administered 2017-10-10: 100 mg via ORAL
  Filled 2017-10-10: qty 1

## 2017-10-10 MED ORDER — MAGNESIUM CITRATE PO SOLN
1.0000 | ORAL | Status: DC | PRN
  Filled 2017-10-10: qty 296

## 2017-10-10 MED ORDER — OXYCODONE HCL 5 MG PO TABS: 5.0000 mg | ORAL_TABLET | ORAL | 0 refills | Status: DC | PRN

## 2017-10-10 NOTE — Discharge Summary (Signed)
Physician Discharge Summary  Patient ID: Tabitha Miller MRN: [id number] DOB/AGE: 70-Sep-1947 70 y.o.  Admit date: 10/08/2017 Discharge date: 10/10/2017  Admission Diagnoses:  PRIMARY OSTEOARTHRITIS OF BOTH KNEES   Discharge Diagnoses: Patient Active Problem List   Diagnosis Date Noted  . Bilateral primary osteoarthritis of knee 10/08/2017    Past Medical History:  Diagnosis Date  . Chickenpox   . COPD (chronic obstructive pulmonary disease) ([place])   . Heart palpitations   . Hyperlipidemia   . Osteoarthritis (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)    fingers, shoulders, knees  . Vertigo approx 01/16   x1     Transfusion: none   Consultants (if any):   Discharged Condition: Improved  Hospital Course: Tabitha Miller is an 72 y.o. female who was admitted 10/08/2017 with a diagnosis of  Bilateral knee osteoarthritis and went to the operating room on 10/08/2017 and underwent the above named procedures.    Surgeries: Procedure(s): TOTAL KNEE BILATERAL on 10/08/2017 Patient tolerated the surgery well. Taken to PACU where she was stabilized and then transferred to the orthopedic floor.  Started on Lovenox 30q 12 hrs. Foot pumps applied bilaterally at 80 mm. Heels elevated on bed with rolled towels. No evidence of DVT. Negative Homan. Physical therapy started on day #1 for gait training and transfer. OT started day #1 for ADL and assisted devices.  Patient's foley was d/c on day #1. Patient's IV was d/c on day #2.  On post op day #3 patient was stable and ready for discharge to SNF.  Implants:  Medacta GMK knee system, right knee right 4 N femur with 3 tibia and 12 mm PS insert short stem and #2 patella all components cemented, left same with left tibia and femur size right narrow and 3 tibia    She was given perioperative antibiotics:  Anti-infectives (From admission, onward)   Start     Dose/Rate Route Frequency Ordered Stop   10/08/17 1400  ceFAZolin (ANCEF) IVPB 2g/100 mL  premix     2 g 200 mL/hr over 30 Minutes Intravenous Every 6 hours 10/08/17 1353 10/09/17 0229   10/08/17 0630  ceFAZolin (ANCEF) IVPB 2g/100 mL premix     2 g 200 mL/hr over 30 Minutes Intravenous  Once 10/08/17 0618 10/08/17 0811   10/08/17 0601  ceFAZolin (ANCEF) 2-4 GM/100ML-% IVPB    Comments:  Register, Karen   : cabinet override      10/08/17 0601 10/08/17 0741    .  She was given sequential compression devices, early ambulation, and Lovenox for DVT prophylaxis.  She benefited maximally from the hospital stay and there were no complications.    Recent vital signs:  Vitals:   10/10/17 0425 10/10/17 0734  BP: 131/79 125/61  Pulse: 72 67  Resp: 19 17  Temp: 98.9 F (37.2 C) 97.7 F (36.5 C)  SpO2: 99% 94%    Recent laboratory studies:  Lab Results  Component Value Date   HGB 10.0 (L) 10/10/2017   HGB 10.6 (L) 10/09/2017   HGB 11.3 (L) 10/08/2017   Lab Results  Component Value Date   WBC 9.0 10/10/2017   PLT 240 10/10/2017   Lab Results  Component Value Date   INR 0.87 09/05/2017   Lab Results  Component Value Date   NA 133 (L) 10/10/2017   K 3.5 10/10/2017   CL 98 (L) 10/10/2017   CO2 25 10/10/2017   BUN 15 10/10/2017   CREATININE 0.81 10/10/2017  GLUCOSE 106 (H) 10/10/2017    Discharge Medications:   Allergies as of 10/10/2017      Reactions   Albumen, Egg Swelling   Other Other (See Comments)   Permanent hair color - severe scalp break out, outside ear infection, scabs      Medication List    TAKE these medications   aspirin 81 MG tablet Take 81 mg by mouth daily.   atenolol 25 MG tablet Commonly known as:  TENORMIN Take 25 mg by mouth at bedtime.   atorvastatin 10 MG tablet Commonly known as:  LIPITOR Take 10 mg by mouth at bedtime.   Chelated Zinc 50 MG Tabs Take 1 tablet by mouth at bedtime.   chlorhexidine 0.12 % solution Commonly known as:  PERIDEX Use as directed 15 mLs in the mouth or throat as needed.   clobetasol cream  0.05 % Commonly known as:  TEMOVATE Apply 1 application topically as needed.   Co Q 10 100 MG Caps Take 1 capsule by mouth daily.   docusate sodium 100 MG capsule Commonly known as:  COLACE Take 1 capsule (100 mg total) by mouth daily.   enoxaparin 40 MG/0.4ML injection Commonly known as:  LOVENOX Inject 0.4 mLs (40 mg total) into the skin daily for 14 days.   FENUGREEK PO Take 2 tablets by mouth daily. Fenugreek and thyme 350 mg/150mg    FLUoxetine 20 MG capsule Commonly known as:  PROZAC Take 20 mg by mouth daily.   Ginkgo Biloba 120 MG Caps Take 1 capsule by mouth daily.   GRAPE SEED PO Take 1 capsule by mouth at bedtime.   GREEN TEA EXTRACT PO Take 1 tablet by mouth at bedtime.   magnesium oxide 400 MG tablet Commonly known as:  MAG-OX Take 400 mg by mouth at bedtime.   oxyCODONE 5 MG immediate release tablet Commonly known as:  Oxy IR/ROXICODONE Take 1-2 tablets (5-10 mg total) by mouth every 4 (four) hours as needed for moderate pain ((score 4 to 6)).   triamterene-hydrochlorothiazide 37.5-25 MG tablet Commonly known as:  MAXZIDE-25 Take 1 tablet by mouth at bedtime.   vitamin E 400 UNIT capsule Take 400 Units by mouth at bedtime.   WOMENS MULTIVITAMIN PO Take 1 tablet by mouth at bedtime.            Durable Medical Equipment  (From admission, onward)        Start     Ordered   10/08/17 1354  DME Walker rolling  Once    Question:  Patient needs a walker to treat with the following condition  Answer:  Status post revision of total knee replacement, bilateral   10/08/17 1353   10/08/17 1354  DME 3 n 1  Once     10/08/17 1353   10/08/17 1354  DME Bedside commode  Once    Question:  Patient needs a bedside commode to treat with the following condition  Answer:  Status post revision of total knee replacement, bilateral   10/08/17 1353      Diagnostic Studies: Dg Knee 1-2 Views Left  Result Date: 10/08/2017 CLINICAL DATA:  Status post left knee  joint prosthesis placement. EXAM: LEFT KNEE - 1-2 VIEW COMPARISON:  CT scan of the left knee of July 17, 2017 FINDINGS: The patient has undergone knee joint prosthesis placement. Radiographic positioning of the prosthetic components is good. The interface with the native bone appears normal. There are surgical skin staples present. IMPRESSION: No immediate postprocedure complication following  left total knee joint prosthesis placement. Electronically Signed   By: David  Martinique M.D.   On: 10/08/2017 12:19   Dg Knee 1-2 Views Right  Result Date: 10/08/2017 CLINICAL DATA:  Status post right total knee joint prosthesis placement. EXAM: RIGHT KNEE - 1-2 VIEW COMPARISON:  CT scan of the right knee of July 17, 2017 FINDINGS: The patient has undergone total joint prosthesis placement. Radiographic positioning of the prosthetic components is good. The interface with the native bone appears normal. Surgical skin staples are present. IMPRESSION: There is no immediate postprocedure complication following left knee joint prosthesis placement. Electronically Signed   By: David  Martinique M.D.   On: 10/08/2017 12:20   Ct Abdomen Pelvis W Contrast  Result Date: 09/17/2017 CLINICAL DATA:  Left lower quadrant pain 3 weeks. Previous appendectomy. EXAM: CT ABDOMEN AND PELVIS WITH CONTRAST TECHNIQUE: Multidetector CT imaging of the abdomen and pelvis was performed using the standard protocol following bolus administration of intravenous contrast. CONTRAST:  149mL ISOVUE-300 IOPAMIDOL (ISOVUE-300) INJECTION 61% COMPARISON:  12/18/2010 and 01/09/2008 FINDINGS: Lower chest: Lung bases demonstrate subtle pleural based linear opacification over the medial right lower lobe likely atelectasis. 9 mm peripheral nodular density over the right base slightly larger. Moderate calcified plaque over the 3 vessel coronary arteries. Hepatobiliary: Possible small single gallstone. Liver and biliary tree are normal. Pancreas: Normal. Spleen:  Normal. Adrenals/Urinary Tract: Adrenal glands are normal. Kidneys are normal in size, shape and position without nephrolithiasis or hydronephrosis. Minimal stable prominence of the right renal pelvis. Multiple bilateral renal cortical hypodensities compatible with cysts, although some too small to characterize. Ureters and bladder are normal. Very distal ureters somewhat obscured by streak artifact from left hip prosthesis. Stomach/Bowel: Stomach and small bowel are within normal. Previous appendectomy. Minimal diverticulosis of the sigmoid colon without definite inflammation. Mild wall thickening over a long segment of the rectosigmoid colon without focal mass or adjacent adenopathy. There is no adjacent inflammatory change or free fluid. Findings may be due to mild acute colitis. Vascular/Lymphatic: Mild calcified plaque over the abdominal aorta. Remaining vascular structures are unremarkable. No significant adenopathy. Reproductive: Within normal. Other: No free fluid or focal inflammatory change. Musculoskeletal: Moderate degenerate changes of the spinal multilevel disc disease over the lumbar spine and lower thoracic spine. Moderate degenerate change of the right hip. Left hip prosthesis intact. IMPRESSION: Wall thickening over a long segment of the rectosigmoid colon without significant adjacent inflammatory change or free fluid. Findings likely due to mild acute colitis of infectious or inflammatory nature. 9 mm nodular density over the right base slightly enlarged compared to 2009. Recommend follow-up CT 6 months. Multiple bilateral renal cortical hypodensities compatible with cysts although some too small to characterize but likely cysts. Possible tiny single gallstone. Atherosclerotic coronary artery disease. Aortic Atherosclerosis (ICD10-I70.0). Electronically Signed   By: Marin Olp M.D.   On: 09/17/2017 10:04    Disposition: 01-Home or Self Care     Contact information for follow-up providers     Hessie Knows, MD Follow up in 2 week(s).   Specialty:  Orthopedic Surgery Contact information: [place] [address] [phone]            Contact information for after-discharge care    [facility] Follow up.   Service:  Skilled Chiropodist information: [address] [place] [phone]  Signed: Dorise Hiss CHRISTOPHER 10/10/2017, 12:37 PM

## 2017-10-10 NOTE — Progress Notes (Signed)
Physical Therapy Treatment Patient Details Name: Tabitha Miller MRN: [id number] DOB: 1946-03-10 Today's Date: 10/10/2017    History of Present Illness Pt underwent bilateral TKA with significant post-op pain which prevented her from participating with therapy on POD#0. She is fully medicated this AM and agrees to workw ith therapy. PMH includes CAD, COPD (not O2 dependent), OA, and vertigo    PT Comments    Pt with improved pain control this morning compared to prior sessions. She agrees to bed exercises and limited ambulation. Pt able to slightly progress her ambulation distance to get to the recliner this morning but refuses to ambulate farther due to increase in knee pain. She becomes tearful once arriving at recliner due to pain. Pt refuses to stay upright and requests immediate transfer back to her bed. Pt returned to bed and set up for breakfast. Will obtain ROM measures during PM session and attempt to progress ambulation. RN notified of some mild confusion today and reports of visual and auditory hallucinations last night. Pt will benefit from PT services to address deficits in strength, balance, and mobility in order to return to full function at home.    Follow Up Recommendations  SNF(Pt refused CIR)     Equipment Recommendations  None recommended by PT    Recommendations for Other Services       Precautions / Restrictions Precautions Precautions: Fall;Knee Restrictions Weight Bearing Restrictions: Yes RLE Weight Bearing: Weight bearing as tolerated LLE Weight Bearing: Weight bearing as tolerated    Mobility  Bed Mobility Overal bed mobility: Needs Assistance Bed Mobility: Supine to Sit     Supine to sit: Min assist     General bed mobility comments: Pt requires assist for bilateral LE's when moving to EOB and when laying down  Transfers Overall transfer level: Needs assistance Equipment used: Rolling walker (2 wheeled) Transfers: Sit to/from Stand Sit to  Stand: Mod assist;+2 physical assistance         General transfer comment: Pt provided cues for transfers and HOB elevated. Excessive forward trunk lean to come to standing and requires UEs to push up to standing  Ambulation/Gait Ambulation/Gait assistance: Mod assist;+2 physical assistance Ambulation Distance (Feet): 5 Feet Assistive device: Rolling walker (2 wheeled) Gait Pattern/deviations: Decreased step length - right;Decreased step length - left Gait velocity: Decreased Gait velocity interpretation: <1.8 ft/sec, indicative of risk for recurrent falls General Gait Details: Pt able to slightly progress her ambulation distance to get to the recliner this morning but refuses to ambulate farther due to increase in knee pain. She becomes tearful once arriving at recliner. Pt refuses to stay upright and requests immediate transfer back to her bed.    Stairs            Wheelchair Mobility    Modified Rankin (Stroke Patients Only)       Balance Overall balance assessment: Needs assistance Sitting-balance support: No upper extremity supported Sitting balance-Leahy Scale: Good     Standing balance support: Bilateral upper extremity supported Standing balance-Leahy Scale: Fair Standing balance comment: Able to balance with bilateral UE support on walker                            Cognition Arousal/Alertness: Awake/alert Behavior During Therapy: WFL for tasks assessed/performed Overall Cognitive Status: Impaired/Different from baseline  General Comments: Pt demonstrates some mild confusion today during PT treatment. Reports hallucinating overnight and is still mildly confused. RN notified. At least 90% of her conversation is completely appropriate with only vague signs of intermittent confusion      Exercises Total Joint Exercises Ankle Circles/Pumps: Both;15 reps Quad Sets: Both;15 reps Gluteal Sets: Both;15  reps Towel Squeeze: Both;10 reps Short Arc Quad: Both;10 reps Heel Slides: Both;10 reps Hip ABduction/ADduction: Both;10 reps Straight Leg Raises: Both;10 reps Goniometric ROM: To be measured during PM session    General Comments        Pertinent Vitals/Pain Pain Assessment: Faces Faces Pain Scale: Hurts whole lot Pain Location: Bilateral knees, pt won't answer when asked NPRS Pain Descriptors / Indicators: Operative site guarding Pain Intervention(s): Monitored during session;Premedicated before session    Home Living                      Prior Function            PT Goals (current goals can now be found in the care plan section) Acute Rehab PT Goals Patient Stated Goal: Return to prior function at home PT Goal Formulation: With patient Time For Goal Achievement: 10/23/17 Potential to Achieve Goals: Good Progress towards PT goals: Progressing toward goals    Frequency    BID      PT Plan Current plan remains appropriate    Co-evaluation              AM-PAC PT "6 Clicks" Daily Activity  Outcome Measure  Difficulty turning over in bed (including adjusting bedclothes, sheets and blankets)?: Unable Difficulty moving from lying on back to sitting on the side of the bed? : Unable Difficulty sitting down on and standing up from a chair with arms (e.g., wheelchair, bedside commode, etc,.)?: Unable Help needed moving to and from a bed to chair (including a wheelchair)?: A Lot Help needed walking in hospital room?: A Lot Help needed climbing 3-5 steps with a railing? : Total 6 Click Score: 8    End of Session Equipment Utilized During Treatment: Gait belt Activity Tolerance: Patient tolerated treatment well Patient left: with call bell/phone within reach;with SCD's reapplied;Other (comment);in bed;with bed alarm set(towel roll under heel, polar care in place) Nurse Communication: Mobility status PT Visit Diagnosis: Unsteadiness on feet  (R26.81);Muscle weakness (generalized) (M62.81);Difficulty in walking, not elsewhere classified (R26.2);Pain Pain - Right/Left: (Bilateral) Pain - part of body: Knee     Time: 3734-2876 PT Time Calculation (min) (ACUTE ONLY): 30 min  Charges:  $Therapeutic Exercise: 8-22 mins $Therapeutic Activity: 8-22 mins                    G Codes:       Lyndel Safe Ranetta Armacost PT, DPT     Zollie Clemence 10/10/2017, 12:18 PM

## 2017-10-10 NOTE — Progress Notes (Signed)
Patient is medically stable for D/C to [facility] today. Per [facility] admissions coordinator at [facility] patient can come today to room 6 and [facility] authorization has been received. Clinical Education officer, museum (CSW) sent D/C orders to [facility] via [facility]. Patient is aware of above. CSW contacted patient's husband Mr. Pottinger and made him aware of above. Please reconsult if future social work needs arise. CSW signing off.   [employer], LCSW [phone]

## 2017-10-10 NOTE — Progress Notes (Signed)
Occupational Therapy Treatment Patient Details Name: CYRIL RAILEY MRN: [id number] DOB: 06-24-1946 Today's Date: 10/10/2017    History of present illness Pt underwent bilateral TKA with significant post-op pain which prevented her from participating with therapy on POD#0. She is fully medicated this AM and agrees to workw ith therapy. PMH includes CAD, COPD (not O2 dependent), OA, and vertigo   OT comments  Pt seen for brief OT treatment session. Pt verbalizes how she typically dresses using reacher at home. OT instructed pt in techniques with AE for LB dressing to improve independence s/p bilat TKA. Pt would benefit from additional training to support recall and carry over as pt is very sleepy and declines to trial this session. Agreeable to practicing at later date/time. Will continue to progress. STR remains appropriate.    Follow Up Recommendations  SNF    Equipment Recommendations  None recommended by OT    Recommendations for Other Services      Precautions / Restrictions Precautions Precautions: Fall;Knee Restrictions Weight Bearing Restrictions: Yes RLE Weight Bearing: Weight bearing as tolerated LLE Weight Bearing: Weight bearing as tolerated       Mobility Bed Mobility                  Transfers                      Balance                                           ADL either performed or assessed with clinical judgement   ADL Overall ADL's : Needs assistance/impaired                       Lower Body Dressing Details (indicate cue type and reason): Pt verbalizes how she typically dresses using reacher at home. OT instructed pt in techniques with AE for LB dressing to improve independence s/p bilat TKA. Pt would benefit from additional training to support recall and carry over as pt is very sleepy and declines to trial this session. Agreeable to practicing at later date/time.                      Vision  Baseline Vision/History: Wears glasses Wears Glasses: At all times Patient Visual Report: No change from baseline     Perception     Praxis      Cognition Arousal/Alertness: Lethargic;Suspect due to medications Behavior During Therapy: [facility] for tasks assessed/performed Overall Cognitive Status: Within Functional Limits for tasks assessed                                 General Comments: pt very groggy, reporting no pain after pain medication, requires VC to remain alert to OT        Exercises     Shoulder Instructions       General Comments      Pertinent Vitals/ Pain       Pain Assessment: No/denies pain Pain Intervention(s): Monitored during session  Home Living                                          Prior  Functioning/Environment              Frequency  Min 2X/week        Progress Toward Goals  OT Goals(current goals can now be found in the care plan section)  Progress towards OT goals: Progressing toward goals  Acute Rehab OT Goals Patient Stated Goal: Return to prior function at home OT Goal Formulation: With patient Time For Goal Achievement: 10/23/17 Potential to Achieve Goals: Good  Plan Discharge plan remains appropriate;Frequency remains appropriate    Co-evaluation                 AM-PAC PT "6 Clicks" Daily Activity     Outcome Measure   Help from another person eating meals?: None Help from another person taking care of personal grooming?: None Help from another person toileting, which includes using toliet, bedpan, or urinal?: A Little Help from another person bathing (including washing, rinsing, drying)?: A Lot Help from another person to put on and taking off regular upper body clothing?: A Little Help from another person to put on and taking off regular lower body clothing?: A Lot 6 Click Score: 18    End of Session    OT Visit Diagnosis: Other abnormalities of gait and mobility  (R26.89)   Activity Tolerance Patient limited by fatigue;Patient limited by lethargy   Patient Left in bed;with call bell/phone within reach;with bed alarm set;Other (comment)(polar care in place bilat)   Nurse Communication          Time: 6967-8938 OT Time Calculation (min): 11 min  Charges: OT General Charges $OT Visit: 1 Visit OT Treatments $Self Care/Home Management : 8-22 mins   Jeni Salles, MPH, MS, OTR/L ascom [phone] 10/10/17, 11:41 AM

## 2017-10-10 NOTE — Discharge Instructions (Signed)

## 2017-10-10 NOTE — Progress Notes (Signed)
Initial Nutrition Assessment  DOCUMENTATION CODES:   Not applicable  INTERVENTION:   Continue Ensure Enlive po BID, each supplement provides 350kcals and 20g of protein.  Encouraged pt to vocalize need to finish meals before PT.   NUTRITION DIAGNOSIS:   Increased nutrient needs related to post-op healing as evidenced by estimated needs.  GOAL:   Patient will meet greater than or equal to 90% of their needs  MONITOR:   PO intake, Weight trends, Skin, I & O's  REASON FOR ASSESSMENT:   Malnutrition Screening Tool    ASSESSMENT:   72 y.o. female here for bilateral total knee arthroplasty with Dr. Rudene Christians on 10/08/2017. PMH of bilateral knee osteoarthritis, COPD, HTN, HLD, L hip replacement 2015.  Pt reports her appetite is returning but it not able to eat her meals due to multiple interruptions for PT. Pt states nausea is only present during intense pain with PT. Pt does report not being able to have a bowel movement this admission.  PTA pt reports eating well and drinking Boost 2x/day for a few days to "get ready for surgery."  Pt began drinking Ensure at time of visit and would like to continue with Ensure BID. Pt's family is also bringing nutrition supplements/milk. Intern encouraged pt to vocalize her want/need to finish her meals before starting PT; Intern explained the importance of nutrition to aid in the healing process after surgery.  Pt reports a 3lb weight loss over the past two weeks. Per chart review, pt's wt appears to be stable with a slight, but insignificant, wt loss as outlined below. Pt's wt could not obtained at time of visit.  Medications; Colace, Ferrous [id number] C-folic acid 3x/day, magnesium oxide, MVI, vitamin E, Zinc sulfate.  Labs: Na (L; 133), Ca (L;8.8)  NUTRITION - FOCUSED PHYSICAL EXAM:  NFPE limited to upper body. No muscle or fat depletions noted.   Diet Order:  Diet regular Room service appropriate? Yes; Fluid consistency:  Thin  EDUCATION NEEDS:   Education needs have been addressed  Skin:  Skin Assessment: Skin Integrity Issues: Skin Integrity Issues:: Incisions Incisions: Bilateral knees  Last BM:  PTA  Height:   Ht Readings from Last 1 Encounters:  10/08/17 5' 5.5" (1.664 m)    Weight:   Wt Readings from Last 1 Encounters:  10/08/17 133 lb (60.3 kg)  at [place]: 10/03/17  133 12.8oz   08/28/17  138lb   06/26/17 138lb 3.2oz     Ideal Body Weight:  57.95 kg  BMI:  Body mass index is 21.8 kg/m.  Estimated Nutritional Needs:   Kcal:  1400-1600  Protein:  75-85g  Fluid:  1.4-1.6L  Dajsha Massaro, MS, Dietetic Intern Pager # [phone]

## 2017-10-10 NOTE — Clinical Social Work Placement (Signed)
   CLINICAL SOCIAL WORK PLACEMENT  NOTE  Date:  10/10/2017  Patient Details  Name: Tabitha Miller MRN: [id number] Date of Birth: Jul 11, 1946  Clinical Social Work is seeking post-discharge placement for this patient at the [place] level of care (*CSW will initial, date and re-position this form in  chart as items are completed):  Yes   Patient/family provided with [place] Work Department's list of facilities offering this level of care within the geographic area requested by the patient (or if unable, by the patient's family).  Yes   Patient/family informed of their freedom to choose among providers that offer the needed level of care, that participate in Medicare, Medicaid or managed care program needed by the patient, have an available bed and are willing to accept the patient.  Yes   Patient/family informed of [facility]'s ownership interest in [facility] and [facility], as well as of the fact that they are under no obligation to receive care at these facilities.  PASRR submitted to EDS on       PASRR number received on       Existing PASRR number confirmed on 10/08/17     FL2 transmitted to all facilities in geographic area requested by pt/family on 10/08/17     FL2 transmitted to all facilities within larger geographic area on       Patient informed that his/her managed care company has contracts with or will negotiate with certain facilities, including the following:        Yes   Patient/family informed of bed offers received.  Patient chooses bed at [facility] )     Physician recommends and patient chooses bed at      Patient to be transferred to [facility]) on 10/10/17.  Patient to be transferred to facility by [facility] )     Patient family notified on 10/10/17 of transfer.  Name of family member notified:  (Patient's husband Mr. Oliveria is aware of D/C today. )     PHYSICIAN        Additional Comment:    _______________________________________________ Zamira Hickam, Veronia Beets, LCSW 10/10/2017, 1:34 PM

## 2017-10-10 NOTE — Progress Notes (Signed)
Pt ready for d/c to SNF today per MD. Pt did have BM. Report called to Pamala Hurry at [facility], all questions answered. EMS transportation set up to facility. Pt's husband at bedside. PIV removed, belongings packed and will be sent with pt.   [facility], Jerry Caras

## 2017-10-10 NOTE — Progress Notes (Signed)
   Subjective: 2 Days Post-Op Procedure(s) (LRB): TOTAL KNEE BILATERAL (Bilateral) Patient reports pain as Mild while being still.  She has moderate pain with movement.  Complaining of some right hip pain..   Patient is well, and has had no acute complaints or problems Denies any abdominal pain nausea or vomiting.  She is passing gas.  We will continue with PT today.  Possible discharge to rehab facility pending bowel movement.   Objective: Vital signs in last 24 hours: Temp:  [97.7 F (36.5 C)-98.9 F (37.2 C)] 97.7 F (36.5 C) (02/14 0734) Pulse Rate:  [62-72] 67 (02/14 0734) Resp:  [17-19] 17 (02/14 0734) BP: (98-131)/(57-79) 125/61 (02/14 0734) SpO2:  [90 %-99 %] 94 % (02/14 0734)  Intake/Output from previous day: 02/13 0701 - 02/14 0700 In: 120 [P.O.:120] Out: 200 [Urine:200] Intake/Output this shift: Total I/O In: 120 [P.O.:120] Out: -   Recent Labs    10/08/17 1410 10/09/17 0845 10/10/17 0450  HGB 11.3* 10.6* 10.0*   Recent Labs    10/09/17 0845 10/10/17 0450  WBC 8.2 9.0  RBC 3.04* 2.89*  HCT 31.2* 29.5*  PLT 253 240   Recent Labs    10/09/17 0845 10/10/17 0450  NA 132* 133*  K 3.4* 3.5  CL 98* 98*  CO2 26 25  BUN 15 15  CREATININE 0.77 0.81  GLUCOSE 116* 106*  CALCIUM 8.5* 8.8*   No results for input(s): LABPT, INR in the last 72 hours.  EXAM General - Patient is Alert, Appropriate and Oriented Extremity - Neurovascular intact Sensation intact distally Intact pulses distally Dorsiflexion/Plantar flexion intact No cellulitis present Compartment soft Dressing - dressing C/D/I and no drainage, bilateral dressings changed no active drainage Motor Function - intact, moving foot and toes well on exam.   Past Medical History:  Diagnosis Date  . Chickenpox   . COPD (chronic obstructive pulmonary disease) ([place])   . Heart palpitations   . Hyperlipidemia   . Osteoarthritis (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)    fingers, shoulders,  knees  . Vertigo approx 01/16   x1    Assessment/Plan:   2 Days Post-Op Procedure(s) (LRB): TOTAL KNEE BILATERAL (Bilateral) Active Problems:   Bilateral primary osteoarthritis of knee  Estimated body mass index is 21.8 kg/m as calculated from the following:   Height as of this encounter: 5' 5.5" (1.664 m).   Weight as of this encounter: 60.3 kg (133 lb). Advance diet Up with therapy  Discharge to skilled nursing facility today pending bowel movement. Acute post op blood loss anemia -hemoglobin 10.0.  Start iron supplementation. Vital signs are stable.   DVT Prophylaxis - Lovenox, Foot Pumps and TED hose Weight-Bearing as tolerated to Left and right legs   T. Rachelle Hora, PA-C [place] 10/10/2017, 8:58 AM

## 2018-03-24 ENCOUNTER — Other Ambulatory Visit: Payer: Self-pay | Admitting: Internal Medicine

## 2018-03-24 DIAGNOSIS — Z1231 Encounter for screening mammogram for malignant neoplasm of breast: Secondary | ICD-10-CM

## 2018-04-29 ENCOUNTER — Ambulatory Visit
Admission: RE | Admit: 2018-04-29 | Discharge: 2018-04-29 | Disposition: A | Discharge status: Home / Self Care | Payer: Medicare Other | Source: Ambulatory Visit | Attending: Internal Medicine | Admitting: Internal Medicine

## 2018-04-29 DIAGNOSIS — Z1231 Encounter for screening mammogram for malignant neoplasm of breast: Secondary | ICD-10-CM | POA: Diagnosis not present

## 2018-08-21 ENCOUNTER — Encounter: Payer: Self-pay | Admitting: Emergency Medicine

## 2018-08-21 ENCOUNTER — Emergency Department: Payer: Medicare Other

## 2018-08-21 ENCOUNTER — Emergency Department
Admission: EM | Admit: 2018-08-21 | Discharge: 2018-08-21 | Disposition: A | Discharge status: Home / Self Care | Payer: Medicare Other | Attending: Emergency Medicine | Admitting: Emergency Medicine

## 2018-08-21 ENCOUNTER — Other Ambulatory Visit: Payer: Self-pay

## 2018-08-21 DIAGNOSIS — S0990XA Unspecified injury of head, initial encounter: Secondary | ICD-10-CM

## 2018-08-21 DIAGNOSIS — W1830XA Fall on same level, unspecified, initial encounter: Secondary | ICD-10-CM | POA: Diagnosis not present

## 2018-08-21 DIAGNOSIS — Y9389 Activity, other specified: Secondary | ICD-10-CM | POA: Insufficient documentation

## 2018-08-21 DIAGNOSIS — F17210 Nicotine dependence, cigarettes, uncomplicated: Secondary | ICD-10-CM | POA: Diagnosis not present

## 2018-08-21 DIAGNOSIS — S93491A Sprain of other ligament of right ankle, initial encounter: Secondary | ICD-10-CM | POA: Insufficient documentation

## 2018-08-21 DIAGNOSIS — S76011A Strain of muscle, fascia and tendon of right hip, initial encounter: Secondary | ICD-10-CM | POA: Diagnosis not present

## 2018-08-21 DIAGNOSIS — Z79899 Other long term (current) drug therapy: Secondary | ICD-10-CM | POA: Insufficient documentation

## 2018-08-21 DIAGNOSIS — J449 Chronic obstructive pulmonary disease, unspecified: Secondary | ICD-10-CM | POA: Diagnosis not present

## 2018-08-21 DIAGNOSIS — Y92511 Restaurant or cafe as the place of occurrence of the external cause: Secondary | ICD-10-CM | POA: Diagnosis not present

## 2018-08-21 DIAGNOSIS — W19XXXA Unspecified fall, initial encounter: Secondary | ICD-10-CM

## 2018-08-21 DIAGNOSIS — Z7982 Long term (current) use of aspirin: Secondary | ICD-10-CM | POA: Diagnosis not present

## 2018-08-21 DIAGNOSIS — Y999 Unspecified external cause status: Secondary | ICD-10-CM | POA: Insufficient documentation

## 2018-08-21 MED ORDER — CYCLOBENZAPRINE HCL 5 MG PO TABS: 5.0000 mg | ORAL_TABLET | Freq: Three times a day (TID) | ORAL | 0 refills | Status: DC | PRN

## 2018-08-21 NOTE — ED Notes (Signed)
See triage note  Presents s/p fall yesterday  States having pain to right side   Right hip into groin area   Some swelling to right ankle

## 2018-08-21 NOTE — ED Notes (Signed)
Pt ambulated with walker in hall without assistance

## 2018-08-21 NOTE — ED Triage Notes (Signed)
Pt with mech fall 12/25 and landed on right side and right face. Here for right hip-groin pain that has increased. Pt is able to ambulate with pain. Pt also with right black eye, HA and knot on right ankle from fall.

## 2018-08-21 NOTE — ED Provider Notes (Signed)
[facility] Emergency Department Provider Note ____________________________________________  Time seen: Approximately 1:35 PM  I have reviewed the triage vital signs and the nursing notes.   HISTORY  Chief Complaint Fall    HPI Tabitha Miller is a 72 y.o. female who presents to the emergency department for evaluation and treatment of facial pain, headache, right side groin pain, right leg pain, and right ankle pain post fall while coming out of [place] yesterday. She denies loss of consciousness. She has been unable to rest or get comfortable since. She does not take ASA or blood thinners.  Past Medical History:  Diagnosis Date  . Chickenpox   . COPD (chronic obstructive pulmonary disease) ([place])   . Heart palpitations   . Hyperlipidemia   . Osteoarthritis (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)    fingers, shoulders, knees  . Vertigo approx 01/16   x1    Patient Active Problem List   Diagnosis Date Noted  . Bilateral primary osteoarthritis of knee 10/08/2017    Past Surgical History:  Procedure Laterality Date  . APPENDECTOMY    . BREAST EXCISIONAL BIOPSY Right 1995   neg  . BUNIONECTOMY Bilateral   . COLONOSCOPY    . EXCISION PARTIAL PHALANX Right 03/08/2016   Procedure: EXCISION PARTIAL PHALANX 5TH RT TOE;  Surgeon: Albertine Patricia, DPM;  Location: [place];  Service: Podiatry;  Laterality: Right;  IVA WITH LOCAL  . JOINT REPLACEMENT    . STERIOD INJECTION Right 03/08/2016   Procedure: STEROID INJECTION;  Surgeon: Albertine Patricia, DPM;  Location: [place];  Service: Podiatry;  Laterality: Right;  . TOE SURGERY Left    fractured 2nd toe  . TOTAL HIP ARTHROPLASTY Left 06/13/14  . TOTAL KNEE ARTHROPLASTY Bilateral 10/08/2017   Procedure: TOTAL KNEE BILATERAL;  Surgeon: Hessie Knows, MD;  Location: [facility];  Service: Orthopedics;  Laterality: Bilateral;    Prior to Admission medications   Medication Sig Start  Date End Date Taking? Authorizing Provider  aspirin 81 MG tablet Take 81 mg by mouth daily.    [provider]  atenolol (TENORMIN) 25 MG tablet Take 25 mg by mouth at bedtime.     [provider]  atorvastatin (LIPITOR) 10 MG tablet Take 10 mg by mouth at bedtime.     [provider]  Bioflavonoid Products (GRAPE SEED PO) Take 1 capsule by mouth at bedtime.     [provider]  Chelated Zinc 50 MG TABS Take 1 tablet by mouth at bedtime.    [provider]  chlorhexidine (PERIDEX) 0.12 % solution Use as directed 15 mLs in the mouth or throat as needed.    [provider]  clobetasol cream (TEMOVATE) 8.10 % Apply 1 application topically as needed.    [provider]  Coenzyme Q10 (CO Q 10) 100 MG CAPS Take 1 capsule by mouth daily.    [provider]  cyclobenzaprine (FLEXERIL) 5 MG tablet Take 1 tablet (5 mg total) by mouth 3 (three) times daily as needed. 08/21/18   Suhan Paci, Johnette Abraham B, FNP  docusate sodium (COLACE) 100 MG capsule Take 1 capsule (100 mg total) by mouth daily. 10/10/17   Duanne Guess, PA-C  enoxaparin (LOVENOX) 40 MG/0.4ML injection Inject 0.4 mLs (40 mg total) into the skin daily for 14 days. 10/10/17 10/24/17  Duanne Guess, PA-C  FENUGREEK PO Take 2 tablets by mouth daily. Fenugreek and thyme 350 mg/150mg     [provider]  FLUoxetine (PROZAC) 20 MG capsule Take 20 mg by mouth daily.    [provider]  Ginkgo Biloba 120 MG CAPS Take 1 capsule by mouth daily.    [provider]  Eloise Levels, Camillia sinensis, (GREEN TEA EXTRACT PO) Take 1 tablet by mouth at bedtime.     [provider]  magnesium oxide (MAG-OX) 400 MG tablet Take 400 mg by mouth at bedtime.     [provider]  Multiple Vitamins-Minerals (WOMENS MULTIVITAMIN PO) Take 1 tablet by mouth at bedtime.    [provider]  oxyCODONE (OXY IR/ROXICODONE) 5 MG immediate release tablet Take 1-2  tablets (5-10 mg total) by mouth every 4 (four) hours as needed for moderate pain ((score 4 to 6)). 10/10/17   Duanne Guess, PA-C  triamterene-hydrochlorothiazide (MAXZIDE-25) 37.5-25 MG tablet Take 1 tablet by mouth at bedtime.     [provider]  vitamin E 400 UNIT capsule Take 400 Units by mouth at bedtime.     [provider]    Allergies Albumen, egg; Eggs or egg-derived products; and Other  Family History  Problem Relation Age of Onset  . Breast cancer Mother 49  . Congestive Heart Failure Mother   . Breast cancer Sister 3  . Breast cancer Maternal Aunt 70  . Melanoma Father   . Heart disease Father     Social History Social History   Tobacco Use  . Smoking status: Current Every Day Smoker    Packs/day: 0.50    Years: 30.00    Pack years: 15.00    Types: Cigarettes  . Smokeless tobacco: Never Used  Substance Use Topics  . Alcohol use: Yes    Comment: occassional  . Drug use: No    Review of Systems Constitutional: Negative for fever. Cardiovascular: Negative for chest pain. Respiratory: Negative for shortness of breath. Musculoskeletal: Positive for right side face, groin, leg, and ankle pain. Skin: Positive for ecchymosis under right eye and right ankle.   Neurological: Negative for decrease in sensation  ____________________________________________   PHYSICAL EXAM:  VITAL SIGNS: ED Triage Vitals  Enc Vitals Group     BP 08/21/18 1210 (!) 145/75     Pulse Rate 08/21/18 1210 70     Resp --      Temp 08/21/18 1208 98.1 F (36.7 C)     Temp Source 08/21/18 1208 Oral     SpO2 08/21/18 1210 98 %     Weight 08/21/18 1212 130 lb (59 kg)     Height 08/21/18 1212 5\' 5"  (1.651 m)     Head Circumference --      Peak Flow --      Pain Score 08/21/18 1211 8     Pain Loc --      Pain Edu? --      Excl. in [place]? --     Constitutional: Alert and oriented. Well appearing and in no acute distress. Eyes: Conjunctivae are clear without  discharge or drainage Head: Atraumatic Neck: Supple. No midline tenderness. Respiratory: No cough. Respirations are even and unlabored. Musculoskeletal: Tenderness over the right orbit, pain in the groin with flexion of the right knee. No obvious deformity of the knee. Lateral swelling of the right ankle. Neurologic: Awake, alert, and oriented.  Skin: Ecchymosis under the right eye. Abrasions to the MCPs of the right hand. Ecchymosis of the right ankle.  Psychiatric: Affect and behavior are appropriate.  ____________________________________________   LABS (all labs ordered are listed, but  only abnormal results are displayed)  Labs Reviewed - No data to display ____________________________________________  RADIOLOGY  Image of the right tibia and fibula show a possibility of an avulsion fracture adjacent to the lateral margin of the talus.  Right femur, pelvis, and ankle are negative for acute bony abnormality.  CT of the head and maxillofacial are negative for acute findings per radiology. ____________________________________________   PROCEDURES  .Splint Application Date/Time: 36/62/9476 2:57 PM Performed by: Donella Stade, RN Authorized by: Victorino Dike, FNP   Consent:    Consent obtained:  Verbal   Consent given by:  Patient   Risks discussed:  Pain Pre-procedure details:    Sensation:  Normal Procedure details:    Laterality:  Right   Location:  Ankle   Splint type:  Ankle stirrup   Supplies:  Prefabricated splint Post-procedure details:    Pain:  Unchanged   Sensation:  Normal   Patient tolerance of procedure:  Tolerated well, no immediate complications  ____________________________________________   INITIAL IMPRESSION / ASSESSMENT AND PLAN / ED COURSE  CIRCE CHILTON is a 72 y.o. who presents to the emergency department for treatment and evaluation after a mechanical, non-syncopal fall. Exam is most concerning for a pelvic and ankle fracture. Images  have been ordered.  ----------------------------------------- 2:58 PM on 08/21/2018 -----------------------------------------  Images and CTs are reassuring.  Results were discussed with the patient and her spouse.  After application of the ankle stirrup splint, the patient was able to walk with the assistance of a walker.  She will be given a referral to see podiatry as the tib-fib image shows a questionable small avulsion fracture off the talus.  Medications - No data to display  Pertinent labs & imaging results that were available during my care of the patient were reviewed by me and considered in my medical decision making (see chart for details).  _________________________________________   FINAL CLINICAL IMPRESSION(S) / ED DIAGNOSES  Final diagnoses:  Head injury, closed, without LOC, initial encounter  Sprain of anterior talofibular ligament of right ankle, initial encounter  Hip strain, right, initial encounter    ED Discharge Orders         Ordered    cyclobenzaprine (FLEXERIL) 5 MG tablet  3 times daily PRN     08/21/18 1509           If controlled substance prescribed during this visit, 12 month history viewed on the [place] prior to issuing an initial prescription for Schedule II or III opiod.    Victorino Dike, FNP 08/21/18 1524    Harvest Dark, MD 08/22/18 1317

## 2018-08-21 NOTE — Discharge Instructions (Signed)
Please follow up with Dr. Doy Hutching if not improving over the week.   You may want to use a walker to help while you are having pain and if you take any of the muscle relaxer. If the muscle relaxer makes you overly sleepy, take half. Change positions slowly so you don't get dizzy.  Return to the ER for symptoms that change or worsen if unable to see PCP or the specialist.

## 2018-09-12 ENCOUNTER — Other Ambulatory Visit (HOSPITAL_COMMUNITY): Payer: Self-pay | Admitting: Internal Medicine

## 2018-09-12 ENCOUNTER — Other Ambulatory Visit: Payer: Self-pay | Admitting: Internal Medicine

## 2018-09-12 DIAGNOSIS — M25551 Pain in right hip: Secondary | ICD-10-CM

## 2018-09-23 ENCOUNTER — Ambulatory Visit
Admission: RE | Admit: 2018-09-23 | Discharge: 2018-09-23 | Disposition: A | Discharge status: Home / Self Care | Payer: Medicare Other | Source: Ambulatory Visit | Attending: Internal Medicine | Admitting: Internal Medicine

## 2018-09-23 DIAGNOSIS — M25551 Pain in right hip: Secondary | ICD-10-CM | POA: Insufficient documentation

## 2019-06-17 ENCOUNTER — Other Ambulatory Visit: Payer: Self-pay | Admitting: Internal Medicine

## 2019-06-17 DIAGNOSIS — Z1231 Encounter for screening mammogram for malignant neoplasm of breast: Secondary | ICD-10-CM

## 2019-07-08 ENCOUNTER — Other Ambulatory Visit: Payer: Self-pay | Admitting: Family Medicine

## 2019-07-08 DIAGNOSIS — R222 Localized swelling, mass and lump, trunk: Secondary | ICD-10-CM

## 2019-07-15 ENCOUNTER — Ambulatory Visit
Admission: RE | Admit: 2019-07-15 | Discharge: 2019-07-15 | Disposition: A | Discharge status: Home / Self Care | Payer: Medicare Other | Source: Ambulatory Visit | Attending: Family Medicine | Admitting: Family Medicine

## 2019-07-15 ENCOUNTER — Other Ambulatory Visit: Payer: Self-pay

## 2019-07-15 DIAGNOSIS — R222 Localized swelling, mass and lump, trunk: Secondary | ICD-10-CM | POA: Insufficient documentation

## 2019-09-03 ENCOUNTER — Ambulatory Visit
Admission: RE | Admit: 2019-09-03 | Discharge: 2019-09-03 | Disposition: A | Discharge status: Home / Self Care | Payer: Medicare PPO | Source: Ambulatory Visit | Attending: Internal Medicine | Admitting: Internal Medicine

## 2019-09-03 DIAGNOSIS — Z1231 Encounter for screening mammogram for malignant neoplasm of breast: Secondary | ICD-10-CM | POA: Insufficient documentation

## 2020-06-08 ENCOUNTER — Other Ambulatory Visit: Payer: Self-pay | Admitting: Internal Medicine

## 2020-06-08 DIAGNOSIS — Z1231 Encounter for screening mammogram for malignant neoplasm of breast: Secondary | ICD-10-CM

## 2020-09-22 ENCOUNTER — Other Ambulatory Visit: Payer: Self-pay | Admitting: Internal Medicine

## 2020-09-22 DIAGNOSIS — Z1231 Encounter for screening mammogram for malignant neoplasm of breast: Secondary | ICD-10-CM

## 2020-12-23 ENCOUNTER — Ambulatory Visit
Admission: RE | Admit: 2020-12-23 | Discharge: 2020-12-23 | Disposition: A | Discharge status: Home / Self Care | Payer: Medicare PPO | Source: Ambulatory Visit | Attending: Internal Medicine | Admitting: Internal Medicine

## 2020-12-23 ENCOUNTER — Other Ambulatory Visit: Payer: Self-pay

## 2020-12-23 DIAGNOSIS — Z1231 Encounter for screening mammogram for malignant neoplasm of breast: Secondary | ICD-10-CM | POA: Diagnosis present

## 2021-02-26 ENCOUNTER — Emergency Department
Admission: EM | Admit: 2021-02-26 | Discharge: 2021-02-26 | Disposition: A | Discharge status: Home / Self Care | Payer: Medicare PPO | Attending: Student | Admitting: Student

## 2021-02-26 ENCOUNTER — Emergency Department: Payer: Medicare PPO

## 2021-02-26 ENCOUNTER — Other Ambulatory Visit: Payer: Self-pay

## 2021-02-26 ENCOUNTER — Encounter: Payer: Self-pay | Admitting: Emergency Medicine

## 2021-02-26 DIAGNOSIS — Z5321 Procedure and treatment not carried out due to patient leaving prior to being seen by health care provider: Secondary | ICD-10-CM | POA: Diagnosis not present

## 2021-02-26 DIAGNOSIS — M25562 Pain in left knee: Secondary | ICD-10-CM | POA: Diagnosis not present

## 2021-02-26 DIAGNOSIS — M25512 Pain in left shoulder: Secondary | ICD-10-CM | POA: Diagnosis present

## 2021-02-26 DIAGNOSIS — W19XXXA Unspecified fall, initial encounter: Secondary | ICD-10-CM | POA: Insufficient documentation

## 2021-02-26 NOTE — ED Notes (Signed)
Pt states she does not want to stay to be seen, pt states she is going to follow up with her doctor on Tuesday. Pt stated " I just wanted some pain killers anyways".  Pt did not want to stay any longer due to wait times.

## 2021-02-26 NOTE — ED Triage Notes (Signed)
Pt reports fell last week and still with pain to left shoulder and knee and would like x-rays on both.

## 2021-03-01 ENCOUNTER — Other Ambulatory Visit: Payer: Self-pay | Admitting: Student

## 2021-03-01 ENCOUNTER — Other Ambulatory Visit: Payer: Self-pay

## 2021-03-01 ENCOUNTER — Ambulatory Visit
Admission: RE | Admit: 2021-03-01 | Discharge: 2021-03-01 | Disposition: A | Discharge status: Home / Self Care | Payer: Medicare PPO | Source: Ambulatory Visit | Attending: Student | Admitting: Student

## 2021-03-01 DIAGNOSIS — S42295A Other nondisplaced fracture of upper end of left humerus, initial encounter for closed fracture: Secondary | ICD-10-CM | POA: Diagnosis not present

## 2021-05-09 ENCOUNTER — Other Ambulatory Visit (HOSPITAL_COMMUNITY): Payer: Self-pay | Admitting: Internal Medicine

## 2021-05-09 ENCOUNTER — Other Ambulatory Visit: Payer: Self-pay | Admitting: Internal Medicine

## 2021-05-09 DIAGNOSIS — R519 Headache, unspecified: Secondary | ICD-10-CM

## 2021-05-25 ENCOUNTER — Ambulatory Visit: Payer: Medicare PPO

## 2021-06-01 ENCOUNTER — Ambulatory Visit
Admission: RE | Admit: 2021-06-01 | Discharge: 2021-06-01 | Disposition: A | Discharge status: Home / Self Care | Payer: Medicare PPO | Source: Ambulatory Visit | Attending: Internal Medicine | Admitting: Internal Medicine

## 2021-06-01 ENCOUNTER — Other Ambulatory Visit: Payer: Self-pay

## 2021-06-01 DIAGNOSIS — R519 Headache, unspecified: Secondary | ICD-10-CM | POA: Insufficient documentation

## 2022-03-07 ENCOUNTER — Other Ambulatory Visit: Payer: Self-pay | Admitting: Internal Medicine

## 2022-03-07 DIAGNOSIS — Z1231 Encounter for screening mammogram for malignant neoplasm of breast: Secondary | ICD-10-CM

## 2022-04-19 ENCOUNTER — Ambulatory Visit
Admission: RE | Admit: 2022-04-19 | Discharge: 2022-04-19 | Disposition: A | Discharge status: Home / Self Care | Payer: Medicare PPO | Source: Ambulatory Visit | Attending: Internal Medicine | Admitting: Internal Medicine

## 2022-04-19 DIAGNOSIS — Z1231 Encounter for screening mammogram for malignant neoplasm of breast: Secondary | ICD-10-CM | POA: Diagnosis present

## 2022-06-10 ENCOUNTER — Emergency Department: Payer: Medicare PPO

## 2022-06-10 ENCOUNTER — Other Ambulatory Visit: Payer: Self-pay

## 2022-06-10 ENCOUNTER — Emergency Department
Admission: EM | Admit: 2022-06-10 | Discharge: 2022-06-10 | Disposition: A | Discharge status: Home / Self Care | Payer: Medicare PPO | Attending: Student in an Organized Health Care Education/Training Program | Admitting: Student in an Organized Health Care Education/Training Program

## 2022-06-10 ENCOUNTER — Encounter: Payer: Self-pay | Admitting: Emergency Medicine

## 2022-06-10 DIAGNOSIS — Z20822 Contact with and (suspected) exposure to covid-19: Secondary | ICD-10-CM | POA: Insufficient documentation

## 2022-06-10 DIAGNOSIS — J449 Chronic obstructive pulmonary disease, unspecified: Secondary | ICD-10-CM | POA: Insufficient documentation

## 2022-06-10 DIAGNOSIS — R0789 Other chest pain: Secondary | ICD-10-CM | POA: Diagnosis present

## 2022-06-10 LAB — CBC
HCT: 40.3 % (ref 36.0–46.0)
Hemoglobin: 13.4 g/dL (ref 12.0–15.0)
MCH: 33.6 pg (ref 26.0–34.0)
MCHC: 33.3 g/dL (ref 30.0–36.0)
MCV: 101 fL — ABNORMAL HIGH (ref 80.0–100.0)
Platelets: 292 10*3/uL (ref 150–400)
RBC: 3.99 MIL/uL (ref 3.87–5.11)
RDW: 13.4 % (ref 11.5–15.5)
WBC: 8.7 10*3/uL (ref 4.0–10.5)
nRBC: 0 % (ref 0.0–0.2)

## 2022-06-10 LAB — BASIC METABOLIC PANEL
Anion gap: 9 (ref 5–15)
BUN: 29 mg/dL — ABNORMAL HIGH (ref 8–23)
CO2: 22 mmol/L (ref 22–32)
Calcium: 9.1 mg/dL (ref 8.9–10.3)
Chloride: 103 mmol/L (ref 98–111)
Creatinine, Ser: 1.22 mg/dL — ABNORMAL HIGH (ref 0.44–1.00)
GFR, Estimated: 47 mL/min — ABNORMAL LOW (ref >60)
Glucose, Bld: 92 mg/dL (ref 70–99)
Potassium: 4 mmol/L (ref 3.5–5.1)
Sodium: 134 mmol/L — ABNORMAL LOW (ref 135–145)

## 2022-06-10 LAB — TROPONIN I (HIGH SENSITIVITY)
Troponin I (High Sensitivity): 5 ng/L (ref <18)
Troponin I (High Sensitivity): 4 ng/L (ref <18)

## 2022-06-10 LAB — RESP PANEL BY RT-PCR (FLU A&B, COVID) ARPGX2
Influenza A by PCR: NEGATIVE
Influenza B by PCR: NEGATIVE
SARS Coronavirus 2 by RT PCR: NEGATIVE

## 2022-06-10 LAB — D-DIMER, QUANTITATIVE: D-Dimer, Quant: 0.34 ug/mL-FEU (ref 0.00–0.50)

## 2022-06-10 MED ORDER — OXYCODONE-ACETAMINOPHEN 5-325 MG PO TABS
1.0000 | ORAL_TABLET | Freq: Once | ORAL | Status: AC
  Administered 2022-06-10: 1 via ORAL
  Filled 2022-06-10: qty 1

## 2022-06-10 MED ORDER — OXYCODONE HCL 5 MG PO TABS: 5.0000 mg | ORAL_TABLET | Freq: Three times a day (TID) | ORAL | 0 refills | Status: DC | PRN

## 2022-06-10 MED ORDER — IPRATROPIUM-ALBUTEROL 0.5-2.5 (3) MG/3ML IN SOLN
3.0000 mL | Freq: Once | RESPIRATORY_TRACT | Status: AC
  Administered 2022-06-10: 3 mL via RESPIRATORY_TRACT
  Filled 2022-06-10: qty 3

## 2022-06-10 NOTE — ED Provider Notes (Signed)
[facility] ([facility]) Provider Note    Event Date/Time   First MD Initiated Contact with Patient 06/10/22 1615     (approximate)   History   Chest Pain   HPI  Tabitha Miller is a 76 y.o. female with history of smoking COPD presents to the ER for evaluation of was initially right-sided chest pain now left-sided chest pain radiating shoulder worsened with deep inspiration and movement.  Denies any shortness of breath no fevers no chills no cold sweats.  States the pain is intermittent.  Is never had pain like this before.  Denies any nausea or vomiting.  No abdominal pain.     Physical Exam   Triage Vital Signs: ED Triage Vitals  Enc Vitals Group     BP 06/10/22 1218 (!) 156/99     Pulse Rate 06/10/22 1218 (!) 59     Resp 06/10/22 1218 18     Temp 06/10/22 1218 98.3 F (36.8 C)     Temp Source 06/10/22 1218 Oral     SpO2 06/10/22 1218 99 %     Weight 06/10/22 1202 119 lb 0.8 oz (54 kg)     Height 06/10/22 1202 '5\' 6"'$  (1.676 m)     Head Circumference --      Peak Flow --      Pain Score 06/10/22 1202 9     Pain Loc --      Pain Edu? --      Excl. in [place]? --     Most recent vital signs: Vitals:   06/10/22 1218  BP: (!) 156/99  Pulse: (!) 59  Resp: 18  Temp: 98.3 F (36.8 C)  SpO2: 99%     Constitutional: Alert  Eyes: Conjunctivae are normal.  Head: Atraumatic. Nose: No congestion/rhinnorhea. Mouth/Throat: Mucous membranes are moist.   Neck: Painless ROM.  Cardiovascular:   Good peripheral circulation. Respiratory: Normal respiratory effort.  No retractions.  Gastrointestinal: Soft and nontender.  Musculoskeletal:  no deformity Neurologic:  MAE spontaneously. No gross focal neurologic deficits are appreciated.  Skin:  Skin is warm, dry and intact. No rash noted. Psychiatric: Mood and affect are normal. Speech and behavior are normal.    ED Results / Procedures / Treatments   Labs (all labs ordered are listed, but only abnormal results  are displayed) Labs Reviewed  BASIC METABOLIC PANEL - Abnormal; Notable for the following components:      Result Value   Sodium 134 (*)    BUN 29 (*)    Creatinine, Ser 1.22 (*)    GFR, Estimated 47 (*)    All other components within normal limits  CBC - Abnormal; Notable for the following components:   MCV 101.0 (*)    All other components within normal limits  RESP PANEL BY RT-PCR (FLU A&B, COVID) ARPGX2  D-DIMER, QUANTITATIVE  TROPONIN I (HIGH SENSITIVITY)  TROPONIN I (HIGH SENSITIVITY)     EKG  ED ECG REPORT I, Merlyn Lot, the attending physician, personally viewed and interpreted this ECG.   Date: 06/10/2022  EKG Time: 12:11  Rate: 60  Rhythm: sinus  Axis: normal  Intervals: normal  ST&T Change: no stemi, no depressions    RADIOLOGY Please see ED Course for my review and interpretation.  I personally reviewed all radiographic images ordered to evaluate for the above acute complaints and reviewed radiology reports and findings.  These findings were personally discussed with the patient.  Please see medical record for radiology report.  PROCEDURES:  Critical Care performed: No  Procedures   MEDICATIONS ORDERED IN ED: Medications  oxyCODONE-acetaminophen (PERCOCET/ROXICET) 5-325 MG per tablet 1 tablet (1 tablet Oral Given 06/10/22 1639)  ipratropium-albuterol (DUONEB) 0.5-2.5 (3) MG/3ML nebulizer solution 3 mL (3 mLs Nebulization Given 06/10/22 1654)     IMPRESSION / MDM / ASSESSMENT AND PLAN / ED COURSE  I reviewed the triage vital signs and the nursing notes.                              Differential diagnosis includes, but is not limited to, ACS, pericarditis, esophagitis, boerhaaves, pe, dissection, pna, bronchitis, costochondritis  Patient presenting to the ER for evaluation of symptoms as described above.  Based on symptoms, risk factors and considered above differential, this presenting complaint could reflect a potentially  life-threatening illness therefore the patient will be placed on continuous pulse oximetry and telemetry for monitoring.  Laboratory evaluation will be sent to evaluate for the above complaints.  Patient nontoxic-appearing no acute distress.  No hypoxia.  No wheezing on exam.  Will observe for repeat cardiac enzymes her first 1 is negative.  EKG with some nonspecific changes but no STEMI or obvious depressions or findings to suggest acute ischemia.  Will order D-dimer to further risk stratify for PE.  Her abdominal exam is soft and benign.   Clinical Course as of 06/10/22 1753  Sun Jun 10, 2022  1753 Patient's D-dimer negative.  Repeat troponin negative.  COVID and viral panel negative.  Did not feel a significant change with nebulizer.  Suspect musculoskeletal strain possible pleurisy.  At this point believe she stable and appropriate for outpatient follow-up and she is currently pain-free. [PR]    Clinical Course User Index [PR] Merlyn Lot, MD   FINAL CLINICAL IMPRESSION(S) / ED DIAGNOSES   Final diagnoses:  Atypical chest pain     Rx / DC Orders   ED Discharge Orders          Ordered    oxyCODONE (ROXICODONE) 5 MG immediate release tablet  Every 8 hours PRN        06/10/22 1750             Note:  This document was prepared using Dragon voice recognition software and may include unintentional dictation errors.    Merlyn Lot, MD 06/10/22 1753

## 2022-06-10 NOTE — ED Provider Triage Note (Signed)
  Emergency Medicine Provider Triage Evaluation Note  Tabitha Miller , a 76 y.o.female,  was evaluated in triage.  Pt complains of chest pain that started yesterday.  Described as intermittent pressure-like sensation.  States that it radiates through to her back.  In addition endorses some nausea as well.   Review of Systems  Positive: Chest pain, nausea. Negative: Denies fever, abdominal pain, vomiting  Physical Exam   Vitals:   06/10/22 1218  BP: (!) 156/99  Pulse: (!) 59  Resp: 18  Temp: 98.3 F (36.8 C)  SpO2: 99%   Gen:   Awake, no distress   Resp:  Normal effort  MSK:   Moves extremities without difficulty  Other:    Medical Decision Making  Given the patient's initial medical screening exam, the following diagnostic evaluation has been ordered. The patient will be placed in the appropriate treatment space, once one is available, to complete the evaluation and treatment. I have discussed the plan of care with the patient and I have advised the patient that an ED physician or mid-level practitioner will reevaluate their condition after the test results have been received, as the results may give them additional insight into the type of treatment they may need.    Diagnostics: Labs, EKG, CXR  Treatments: none immediately   Teodoro Spray, [place] 06/10/22 1318

## 2022-06-10 NOTE — ED Triage Notes (Signed)
Pt reports CP that started yesterday and is intermittent and pressure like in nature. Pt reports pain to her left chest and radiates through to her back. Pt reports has felt nauseated but denies SOB

## 2022-08-28 DIAGNOSIS — Z1211 Encounter for screening for malignant neoplasm of colon: Secondary | ICD-10-CM | POA: Diagnosis not present

## 2022-09-25 ENCOUNTER — Ambulatory Visit: Payer: PPO | Admitting: Certified Registered Nurse Anesthetist

## 2022-09-25 ENCOUNTER — Encounter
Admission: RE | Disposition: A | Discharge status: Home / Self Care | Payer: Self-pay | Source: Home / Self Care | Attending: Gastroenterology

## 2022-09-25 ENCOUNTER — Ambulatory Visit
Admission: RE | Admit: 2022-09-25 | Discharge: 2022-09-25 | Disposition: A | Discharge status: Home / Self Care | Payer: PPO | Attending: Gastroenterology | Admitting: Gastroenterology

## 2022-09-25 ENCOUNTER — Encounter: Payer: Self-pay | Admitting: *Deleted

## 2022-09-25 DIAGNOSIS — D126 Benign neoplasm of colon, unspecified: Secondary | ICD-10-CM | POA: Diagnosis not present

## 2022-09-25 DIAGNOSIS — K5790 Diverticulosis of intestine, part unspecified, without perforation or abscess without bleeding: Secondary | ICD-10-CM | POA: Diagnosis not present

## 2022-09-25 DIAGNOSIS — Z86010 Personal history of colonic polyps: Secondary | ICD-10-CM | POA: Insufficient documentation

## 2022-09-25 DIAGNOSIS — K640 First degree hemorrhoids: Secondary | ICD-10-CM | POA: Insufficient documentation

## 2022-09-25 DIAGNOSIS — K649 Unspecified hemorrhoids: Secondary | ICD-10-CM | POA: Diagnosis not present

## 2022-09-25 DIAGNOSIS — Z1211 Encounter for screening for malignant neoplasm of colon: Secondary | ICD-10-CM | POA: Diagnosis not present

## 2022-09-25 DIAGNOSIS — K635 Polyp of colon: Secondary | ICD-10-CM | POA: Diagnosis not present

## 2022-09-25 DIAGNOSIS — J449 Chronic obstructive pulmonary disease, unspecified: Secondary | ICD-10-CM | POA: Diagnosis not present

## 2022-09-25 DIAGNOSIS — E785 Hyperlipidemia, unspecified: Secondary | ICD-10-CM | POA: Diagnosis not present

## 2022-09-25 DIAGNOSIS — K5730 Diverticulosis of large intestine without perforation or abscess without bleeding: Secondary | ICD-10-CM | POA: Diagnosis not present

## 2022-09-25 DIAGNOSIS — F17210 Nicotine dependence, cigarettes, uncomplicated: Secondary | ICD-10-CM | POA: Insufficient documentation

## 2022-09-25 HISTORY — PX: COLONOSCOPY WITH PROPOFOL: SHX5780

## 2022-09-25 SURGERY — COLONOSCOPY WITH PROPOFOL
Anesthesia: General

## 2022-09-25 MED ORDER — PROPOFOL 10 MG/ML IV BOLUS
INTRAVENOUS | Status: DC | PRN
  Administered 2022-09-25 (×2): 20 mg via INTRAVENOUS
  Administered 2022-09-25: 10 mg via INTRAVENOUS
  Administered 2022-09-25: 50 mg via INTRAVENOUS

## 2022-09-25 MED ORDER — SODIUM CHLORIDE 0.9 % IV SOLN: INTRAVENOUS | Status: DC

## 2022-09-25 MED ORDER — PROPOFOL 500 MG/50ML IV EMUL
INTRAVENOUS | Status: DC | PRN
  Administered 2022-09-25: 140 ug/kg/min via INTRAVENOUS

## 2022-09-25 NOTE — Anesthesia Postprocedure Evaluation (Signed)
Anesthesia Post Note  Patient: Tabitha Miller  Procedure(s) Performed: COLONOSCOPY WITH PROPOFOL  Patient location during evaluation: Endoscopy Anesthesia Type: General Level of consciousness: awake and alert Pain management: pain level controlled Vital Signs Assessment: post-procedure vital signs reviewed and stable Respiratory status: spontaneous breathing, nonlabored ventilation, respiratory function stable and patient connected to nasal cannula oxygen Cardiovascular status: blood pressure returned to baseline and stable Postop Assessment: no apparent nausea or vomiting Anesthetic complications: no   No notable events documented.   Last Vitals:  Vitals:   09/25/22 1125 09/25/22 1135  BP: 113/66 118/76  Pulse: 67 69  Resp: 12 (!) 9  Temp:    SpO2: 96% (!) 88%    Last Pain:  Vitals:   09/25/22 1135  TempSrc:   PainSc: 0-No pain                 Arita Miss

## 2022-09-25 NOTE — Transfer of Care (Signed)
Immediate Anesthesia Transfer of Care Note  Patient: Tabitha Miller  Procedure(s) Performed: COLONOSCOPY WITH PROPOFOL  Patient Location: PACU  Anesthesia Type:General  Level of Consciousness: drowsy  Airway & Oxygen Therapy: Patient Spontanous Breathing and Patient connected to face mask oxygen  Post-op Assessment: Report given to RN and Post -op Vital signs reviewed and stable  Post vital signs: Reviewed and stable  Last Vitals:  Vitals Value Taken Time  BP    Temp    Pulse    Resp    SpO2      Last Pain:  Vitals:   09/25/22 1029  TempSrc: Temporal  PainSc: 0-No pain         Complications: No notable events documented.

## 2022-09-25 NOTE — Anesthesia Procedure Notes (Signed)
Date/Time: 09/25/2022 10:50 AM  Performed by: Demetrius Charity, CRNAPre-anesthesia Checklist: Patient identified, Emergency Drugs available, Suction available, Patient being monitored and Timeout performed Patient Re-evaluated:Patient Re-evaluated prior to induction Oxygen Delivery Method: Nasal cannula Induction Type: IV induction Placement Confirmation: CO2 detector and positive ETCO2

## 2022-09-25 NOTE — Interval H&P Note (Signed)
History and Physical Interval Note:  09/25/2022 10:37 AM  Tabitha Miller  has presented today for surgery, with the diagnosis of HX OF ADENOMATOUS POLYP OF COLON.  The various methods of treatment have been discussed with the patient and family. After consideration of risks, benefits and other options for treatment, the patient has consented to  Procedure(s): COLONOSCOPY WITH PROPOFOL (N/A) as a surgical intervention.  The patient's history has been reviewed, patient examined, no change in status, stable for surgery.  I have reviewed the patient's chart and labs.  Questions were answered to the patient's satisfaction.     Lesly Rubenstein  Ok to proceed with colonoscopy

## 2022-09-25 NOTE — Anesthesia Preprocedure Evaluation (Signed)
Anesthesia Evaluation  Patient identified by MRN, date of birth, ID band Patient awake    Reviewed: Allergy & Precautions, NPO status , Patient's Chart, lab work & pertinent test results  History of Anesthesia Complications Negative for: history of anesthetic complications  Airway Mallampati: II  TM Distance: >3 FB Neck ROM: Full    Dental no notable dental hx. (+) Teeth Intact   Pulmonary neg sleep apnea, COPD, Current SmokerPatient did not abstain from smoking.   Pulmonary exam normal breath sounds clear to auscultation       Cardiovascular Exercise Tolerance: Good METS(-) hypertension(-) CAD and (-) Past MI negative cardio ROS (-) dysrhythmias  Rhythm:Regular Rate:Normal - Systolic murmurs    Neuro/Psych negative neurological ROS  negative psych ROS   GI/Hepatic ,neg GERD  ,,(+)     (-) substance abuse    Endo/Other  neg diabetes    Renal/GU negative Renal ROS     Musculoskeletal   Abdominal   Peds  Hematology   Anesthesia Other Findings Past Medical History: No date: Chickenpox No date: COPD (chronic obstructive pulmonary disease) (HCC) No date: Heart palpitations No date: Hyperlipidemia No date: Osteoarthritis (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)     Comment:  fingers, shoulders, knees approx 01/16: Vertigo     Comment:  x1  Reproductive/Obstetrics                             Anesthesia Physical Anesthesia Plan  ASA: 2  Anesthesia Plan: General   Post-op Pain Management: Minimal or no pain anticipated   Induction: Intravenous  PONV Risk Score and Plan: 2 and Propofol infusion, TIVA and Ondansetron  Airway Management Planned: Nasal Cannula  Additional Equipment: None  Intra-op Plan:   Post-operative Plan:   Informed Consent: I have reviewed the patients History and Physical, chart, labs and discussed the procedure including the risks, benefits and  alternatives for the proposed anesthesia with the patient or authorized representative who has indicated his/her understanding and acceptance.     Dental advisory given  Plan Discussed with: CRNA and Surgeon  Anesthesia Plan Comments: (Discussed risks of anesthesia with patient, including possibility of difficulty with spontaneous ventilation under anesthesia necessitating airway intervention, PONV, and rare risks such as cardiac or respiratory or neurological events, and allergic reactions. Discussed the role of CRNA in patient's perioperative care. Patient understands. Patient counseled on benefits of smoking cessation, and increased perioperative risks associated with continued smoking. )       Anesthesia Quick Evaluation

## 2022-09-25 NOTE — Op Note (Signed)
[facility]astro[facility]ology Patient Name: Tabitha Miller Procedure Date: 09/25/2022 10:12 AM MRN: [id number] Account #: [id number] Date of Birth: 01/22/1946 Admit Type: Outpatient Age: 77 Room: [facility] ENDO ROOM 1 Gender: Female Note Status: Finalized Instrument Name: Jasper Riling [id number] Procedure:             Colonoscopy Indications:           Surveillance: Personal history of adenomatous polyps                         on last colonoscopy > 5 years ago Providers:             Andrey Farmer MD, MD Referring MD:          Leonie Douglas. Doy Hutching, MD (Referring MD) Medicines:             Monitored Anesthesia Care Complications:         No immediate complications. Estimated blood loss:                         Minimal. Procedure:             Pre-Anesthesia Assessment:                        - Prior to the procedure, a History and Physical was                         performed, and patient medications and allergies were                         reviewed. The patient is competent. The risks and                         benefits of the procedure and the sedation options and                         risks were discussed with the patient. All questions                         were answered and informed consent was obtained.                         Patient identification and proposed procedure were                         verified by the physician, the nurse, the                         anesthesiologist, the anesthetist and the technician                         in the endoscopy suite. Mental Status Examination:                         alert and oriented. Airway Examination: normal                         oropharyngeal airway and neck mobility. Respiratory  Examination: clear to auscultation. CV Examination:                         normal. Prophylactic Antibiotics: The patient does not                         require prophylactic antibiotics. Prior                          Anticoagulants: The patient has taken no anticoagulant                         or antiplatelet agents. ASA Grade Assessment: II - A                         patient with mild systemic disease. After reviewing                         the risks and benefits, the patient was deemed in                         satisfactory condition to undergo the procedure. The                         anesthesia plan was to use monitored anesthesia care                         (MAC). Immediately prior to administration of                         medications, the patient was re-assessed for adequacy                         to receive sedatives. The heart rate, respiratory                         rate, oxygen saturations, blood pressure, adequacy of                         pulmonary ventilation, and response to care were                         monitored throughout the procedure. The physical                         status of the patient was re-assessed after the                         procedure.                        After obtaining informed consent, the colonoscope was                         passed under direct vision. Throughout the procedure,                         the patient's blood pressure, pulse, and oxygen  saturations were monitored continuously. The                         Colonoscope was introduced through the anus and                         advanced to the the cecum, identified by appendiceal                         orifice and ileocecal valve. The colonoscopy was                         somewhat difficult due to inadequate bowel prep and a                         redundant colon. Successful completion of the                         procedure was aided by applying abdominal pressure.                         The patient tolerated the procedure well. The quality                         of the bowel preparation was fair except the ascending                          colon was unsatisfactory. Findings:      The perianal and digital rectal examinations were normal.      A few small-mouthed diverticula were found in the sigmoid colon,       descending colon and transverse colon.      A 2 mm polyp was found in the sigmoid colon. The polyp was sessile. The       polyp was removed with a jumbo cold forceps. Resection and retrieval       were complete. Estimated blood loss was minimal.      Internal hemorrhoids were found during retroflexion. The hemorrhoids       were Grade I (internal hemorrhoids that do not prolapse).      The exam was otherwise without abnormality on direct and retroflexion       views. Impression:            - Diverticulosis in the sigmoid colon, in the                         descending colon and in the transverse colon.                        - One 2 mm polyp in the sigmoid colon, removed with a                         jumbo cold forceps. Resected and retrieved.                        - Internal hemorrhoids.                        - The examination was otherwise normal on direct and  retroflexion views. Recommendation:        - Discharge patient to home.                        - Resume previous diet.                        - Continue present medications.                        - Await pathology results.                        - Repeat colonoscopy in 6 months because the bowel                         preparation was suboptimal.                        - Return to referring physician as previously                         scheduled. Procedure Code(s):     --- Professional ---                        [id number], Colonoscopy, flexible; with biopsy, single or                         multiple Diagnosis Code(s):     --- Professional ---                        Z86.010, Personal history of colonic polyps                        K64.0, First degree hemorrhoids                        D12.5, Benign neoplasm of sigmoid colon                         K57.30, Diverticulosis of large intestine without                         perforation or abscess without bleeding CPT copyright 2022 American Medical Association. All rights reserved. The codes documented in this report are preliminary and upon coder review may  be revised to meet current compliance requirements. Andrey Farmer MD, MD 09/25/2022 11:14:45 AM Number of Addenda: 0 Note Initiated On: 09/25/2022 10:12 AM Scope Withdrawal Time: 0 hours 5 minutes 7 seconds  Total Procedure Duration: 0 hours 16 minutes 43 seconds  Estimated Blood Loss:  Estimated blood loss was minimal.      [facility]

## 2022-09-25 NOTE — H&P (Signed)
Outpatient short stay form Pre-procedure 09/25/2022  Tabitha Rubenstein, MD  Primary Physician: Idelle Crouch, MD  Reason for visit:  Surveillance  History of present illness:    77 y/o lady with history of HLD and hypertension here for surveillance colonoscopy. Last colonoscopy with > 10 Ta's. No blood thinners. History of appendectomy. No family history of GI malignancies. She smokes 10 cigarettes daily and states she drinks 1 alcoholic drink daily.    Current Facility-Administered Medications:    0.9 %  sodium chloride infusion, , Intravenous, Continuous, Amarys Sliwinski, Hilton Cork, MD  Medications Prior to Admission  Medication Sig Dispense Refill Last Dose   atorvastatin (LIPITOR) 10 MG tablet Take 10 mg by mouth at bedtime.    09/25/2022 at 0730   aspirin 81 MG tablet Take 81 mg by mouth daily.      atenolol (TENORMIN) 25 MG tablet Take 25 mg by mouth at bedtime.       Bioflavonoid Products (GRAPE SEED PO) Take 1 capsule by mouth at bedtime.       Chelated Zinc 50 MG TABS Take 1 tablet by mouth at bedtime.      chlorhexidine (PERIDEX) 0.12 % solution Use as directed 15 mLs in the mouth or throat as needed.      clobetasol cream (TEMOVATE) 0.04 % Apply 1 application topically as needed.      Coenzyme Q10 (CO Q 10) 100 MG CAPS Take 1 capsule by mouth daily.      cyclobenzaprine (FLEXERIL) 5 MG tablet Take 1 tablet (5 mg total) by mouth 3 (three) times daily as needed. 15 tablet 0    docusate sodium (COLACE) 100 MG capsule Take 1 capsule (100 mg total) by mouth daily. 10 capsule 0    enoxaparin (LOVENOX) 40 MG/0.4ML injection Inject 0.4 mLs (40 mg total) into the skin daily for 14 days. 14 Syringe 0    FENUGREEK PO Take 2 tablets by mouth daily. Fenugreek and thyme 350 mg/'150mg'$       FLUoxetine (PROZAC) 20 MG capsule Take 20 mg by mouth daily.      Ginkgo Biloba 120 MG CAPS Take 1 capsule by mouth daily.      Green Tea, Camillia sinensis, (GREEN TEA EXTRACT PO) Take 1 tablet by mouth at  bedtime.       magnesium oxide (MAG-OX) 400 MG tablet Take 400 mg by mouth at bedtime.       Multiple Vitamins-Minerals (WOMENS MULTIVITAMIN PO) Take 1 tablet by mouth at bedtime.      oxyCODONE (OXY IR/ROXICODONE) 5 MG immediate release tablet Take 1-2 tablets (5-10 mg total) by mouth every 4 (four) hours as needed for moderate pain ((score 4 to 6)). 40 tablet 0    oxyCODONE (ROXICODONE) 5 MG immediate release tablet Take 1 tablet (5 mg total) by mouth every 8 (eight) hours as needed. 7 tablet 0    triamterene-hydrochlorothiazide (MAXZIDE-25) 37.5-25 MG tablet Take 1 tablet by mouth at bedtime.       vitamin E 400 UNIT capsule Take 400 Units by mouth at bedtime.         Allergies  Allergen Reactions   Egg White (Egg Protein) Swelling   Eggs Or Egg-Derived Products    Other Other (See Comments)    Permanent hair color - severe scalp break out, outside ear infection, scabs     Past Medical History:  Diagnosis Date   Chickenpox    COPD (chronic obstructive pulmonary disease) (HCC)    Heart  palpitations    Hyperlipidemia    Osteoarthritis (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)    fingers, shoulders, knees   Vertigo approx 01/16   x1    Review of systems:  Otherwise negative.    Physical Exam  Gen: Alert, oriented. Appears stated age.  HEENT: PERRLA. Lungs: No respiratory distress CV: RRR Abd: soft, benign, no masses Ext: No edema    Planned procedures: Proceed with colonoscopy. The patient understands the nature of the planned procedure, indications, risks, alternatives and potential complications including but not limited to bleeding, infection, perforation, damage to internal organs and possible oversedation/side effects from anesthesia. The patient agrees and gives consent to proceed.  Please refer to procedure notes for findings, recommendations and patient disposition/instructions.     Tabitha Rubenstein, MD [facility]

## 2022-09-26 ENCOUNTER — Ambulatory Visit (INDEPENDENT_AMBULATORY_CARE_PROVIDER_SITE_OTHER): Payer: PPO | Admitting: Psychiatry

## 2022-09-26 ENCOUNTER — Encounter: Payer: Self-pay | Admitting: Psychiatry

## 2022-09-26 VITALS — BP 118/76 | HR 69 | Ht 65.0 in | Wt 120.0 lb

## 2022-09-26 DIAGNOSIS — F4310 Post-traumatic stress disorder, unspecified: Secondary | ICD-10-CM

## 2022-09-26 DIAGNOSIS — F322 Major depressive disorder, single episode, severe without psychotic features: Secondary | ICD-10-CM

## 2022-09-26 LAB — SURGICAL PATHOLOGY

## 2022-09-26 MED ORDER — SERTRALINE HCL 50 MG PO TABS: ORAL_TABLET | ORAL | 1 refills | Status: DC

## 2022-09-26 NOTE — Patient Instructions (Addendum)
-  Please stop Fluoxetine (Prozac).   -Start Sertraline (Zoloft) 50 mg 1/2 tablet daily for 2-4 days, then take one tablet daily for one week, then increase to 2 tabs daily.

## 2022-09-26 NOTE — Progress Notes (Signed)
[facility] MD/PA/NP Initial Note  09/26/2022 7:35 PM Tabitha Miller  MRN:  [id number]  Chief Complaint:  Chief Complaint   Anxiety; Depression     HPI: Pt is a 77 yo female seen today for initial evaluation for anxiety and depression. She reports that she was married for 89 years and it was a "physically and mentally stressful relationship." She describes husband telling her she was "useless" and "worthless." She reports that husband had diabetes, a stroke, went on dialysis, and then she became his caregiver. She reports that he was non-compliant with his diabetic diet, continued to smoke, and would have to drink daily. They sold their home to move into town to be closer to medical providers and hospitals. Husband fell multiple times and on one occasion he sustained a brain bleed. Husband died in 23-Oct-2020. She later discovered a voicemail message from the hospital that she initially missed. She was unable to bring herself to listen to it initially and then accidentally deleted it. She reports that she has rumination about the contents of the message.  She reports that she was an "unhappy and a miserable person" for years in response to marital stress. She reports that she was "argumentative" with others and family members later told her they chose to limit their time with her due to argumentative behavior.   She reports her mood is sad most of the time. She reports that she does not have significant irritability. She reports, "I don't care about myself anymore. I don't care about my house anymore." She reports low energy and motivation. She reports that her motivation is low for grooming, hygiene, and cleaning. She reports that this is not like her and she used to keep her home very clean. She reports avoiding social interaction. She reports canceling plans and avoiding calls from others. She reports that she is able to fall asleep after having 2 drinks. Difficulty staying asleep. She reports  getting an adequate amount of sleep. She reports that she has been losing weight. She reports that she tries to eat something before bed. Decreased appetite. She reports difficulty with concentration and difficulty making decisions. Limited enjoyment in things. Denies SI- "I couldn't do that to my animals."   She reports that she has nightmares and intrusive memories from past traumatic events. She reports flashbacks and hears words. She reports wanting to avoid things that remind her of past traumatic events. She reports that she has exaggerated startle response. She describes some hypervigilance. She reports worry about the future and finances. "I don't have any good thoughts." She reports rumination. Denies catastrophic thoughts. She will experience chest tightness and shortness of breath with anxiety at times. Denies panic attacks. She reports that she "has always preferred to be in control."   Denies decreased need for sleep. Denies elevated mood. Denies any history of impulsive or risky behavior.   Denies paranoia. Denies AH or VH.   "I refer to alcohol more than I should."  She wants to be alone most of the time.   She reports that she "grew up with a very low opinion of myself" in response to critical grandmother. Retired Nature conservation officer. She enjoyed her work and "thrived on it." She reports that she was married 7 years "to the most kind, wonderful, educated man but it wasn't enough." She then divorced and married husband of 33 years. She reports that she "always wanted children." She reports husband had infertility issue that he refused to address and  did not want to adopt. She does not have any children. Moved from her home in [place] 3 years ago. She would like to move, however is unable to move at this time due to a contractural issue. 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 She has enjoyed her pets to include cats and Hadley over  time. She reports that her last dog "turned on me" when she moved him from 3 acres of fenced area to a smaller home in town. She reports that her dog bit her in the face. Dog also would periodically bite her. She later re-homed her dog and this was difficult for her. Dog has since died. She used to enjoy singing. Sister and family is supportive. She interacts some with neighbors.   Past Psychiatric Medication Trials: Prozac- Ineffective Effexor XR- "Did not feel right" after a couple of doses. Wellbutrin XL-Ineffective  Visit Diagnosis:    ICD-10-CM   1. PTSD (post-traumatic stress disorder)  F43.10 sertraline (ZOLOFT) 50 MG tablet    2. Current severe episode of major depressive disorder without psychotic features, unspecified whether recurrent ([place])  F32.2       Past Psychiatric History: She reports that she saw a mental health provider in [place] for 2-3 visits.   Past Medical History:  Past Medical History:  Diagnosis Date   Chickenpox    COPD (chronic obstructive pulmonary disease) ([place])    Heart palpitations    Hyperlipidemia    Osteoarthritis (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)    fingers, shoulders, knees   Vertigo approx 01/16   x1    Past Surgical History:  Procedure Laterality Date   APPENDECTOMY     BREAST EXCISIONAL BIOPSY Right 1995   neg   BUNIONECTOMY Bilateral    COLONOSCOPY     COLONOSCOPY WITH PROPOFOL N/A 09/25/2022   Procedure: COLONOSCOPY WITH PROPOFOL;  Surgeon: Lesly Rubenstein, MD;  Location: [facility];  Service: Endoscopy;  Laterality: N/A;   EXCISION PARTIAL PHALANX Right 03/08/2016   Procedure: EXCISION PARTIAL PHALANX 5TH RT TOE;  Surgeon: Albertine Patricia, DPM;  Location: [place];  Service: Podiatry;  Laterality: Right;  IVA WITH LOCAL   JOINT REPLACEMENT     STERIOD INJECTION Right 03/08/2016   Procedure: STEROID INJECTION;  Surgeon: Albertine Patricia, DPM;  Location: [place];  Service: Podiatry;  Laterality:  Right;   TOE SURGERY Left    fractured 2nd toe   TOTAL HIP ARTHROPLASTY Left 06/13/14   TOTAL KNEE ARTHROPLASTY Bilateral 10/08/2017   Procedure: TOTAL KNEE BILATERAL;  Surgeon: Hessie Knows, MD;  Location: [facility];  Service: Orthopedics;  Laterality: Bilateral;     Family History:  Family History  Problem Relation Age of Onset   Anxiety disorder Mother    Breast cancer Mother 72   Congestive Heart Failure Mother    Melanoma Father    Heart disease Father    Breast cancer Sister 76   Breast cancer Maternal Aunt 70    Social History:  Social History   Socioeconomic History   Marital status: Married    Spouse name: Not on file   Number of children: Not on file   Years of education: Not on file   Highest education level: Not on file  Occupational History   Not on file  Tobacco Use   Smoking status: Every Day    Packs/day: 0.50    Years: 30.00  Total pack years: 15.00    Types: Cigarettes   Smokeless tobacco: Never  Vaping Use   Vaping Use: Never used  Substance and Sexual Activity   Alcohol use: Yes    Comment: 2 drinks a night   Drug use: No   Sexual activity: Not on file  Other Topics Concern   Not on file  Social History Narrative   Not on file   Social Determinants of Health   Financial Resource Strain: Not on file  Food Insecurity: Not on file  Transportation Needs: Not on file  Physical Activity: Not on file  Stress: Not on file  Social Connections: Not on file    Allergies:  Allergies  Allergen Reactions   Egg White (Egg Protein) Swelling   Eggs Or Egg-Derived Products    Other Other (See Comments)    Permanent hair color - severe scalp break out, outside ear infection, scabs    Metabolic Disorder Labs: No results found for: "HGBA1C", "MPG" No results found for: "PROLACTIN" No results found for: "CHOL", "TRIG", "HDL", "CHOLHDL", "VLDL", "LDLCALC" No results found for: "TSH"  Therapeutic Level Labs: No results found for: "LITHIUM" No  results found for: "VALPROATE" No results found for: "CBMZ"  Current Medications: Current Outpatient Medications  Medication Sig Dispense Refill   ascorbic acid (VITAMIN C) 500 MG tablet Take by mouth.     atenolol (TENORMIN) 25 MG tablet Take 25 mg by mouth at bedtime.      atorvastatin (LIPITOR) 10 MG tablet Take 10 mg by mouth at bedtime.      Bioflavonoid Products (GRAPE SEED PO) Take 1 capsule by mouth at bedtime.      buPROPion (WELLBUTRIN XL) 150 MG 24 hr tablet Take 1 tablet by mouth daily.     Calcium-Magnesium-Vitamin D (CALCIUM MAGNESIUM PO) Take by mouth.     Chelated Zinc 50 MG TABS Take 1 tablet by mouth at bedtime.     Coenzyme Q10 (CO Q 10) 100 MG CAPS Take 1 capsule by mouth daily.     FENUGREEK PO Take 2 tablets by mouth daily. Fenugreek and thyme 350 mg/'150mg'$      GARLIC PO Take by mouth.     Ginkgo Biloba 120 MG CAPS Take 1 capsule by mouth daily.     Green Tea, Camillia sinensis, (GREEN TEA EXTRACT PO) Take 1 tablet by mouth at bedtime.      Multiple Vitamins-Minerals (WOMENS MULTIVITAMIN PO) Take 1 tablet by mouth at bedtime.     sertraline (ZOLOFT) 50 MG tablet Take 1/2 tab po qd x 2-4 days, then 1 tab po qd x 1 week, then 2 tabs po qd 60 tablet 1   triamterene-hydrochlorothiazide (MAXZIDE-25) 37.5-25 MG tablet Take 1 tablet by mouth at bedtime.      vitamin E 400 UNIT capsule Take 400 Units by mouth at bedtime.      aspirin 81 MG tablet Take 81 mg by mouth daily. (Patient not taking: Reported on 09/26/2022)     chlorhexidine (PERIDEX) 0.12 % solution Use as directed 15 mLs in the mouth or throat as needed.     Cholecalciferol (VITAMIN D3) 10 MCG (400 UNIT) tablet Take by mouth.     clobetasol cream (TEMOVATE) 2.58 % Apply 1 application topically as needed.     oxyCODONE (ROXICODONE) 5 MG immediate release tablet Take 1 tablet (5 mg total) by mouth every 8 (eight) hours as needed. (Patient not taking: Reported on 09/26/2022) 7 tablet 0   No current  facility-administered medications for this visit.    Medication Side Effects: none  Orders placed this visit:  No orders of the defined types were placed in this encounter.   Psychiatric Specialty Exam:  Review of Systems  Constitutional:  Positive for fatigue.       Reports hair loss  HENT: Negative.    Eyes: Negative.   Respiratory: Negative.    Cardiovascular: Negative.   Gastrointestinal: Negative.   Endocrine: Negative.   Genitourinary: Negative.   Musculoskeletal:  Positive for arthralgias.  Skin: Negative.   Allergic/Immunologic: Negative.   Neurological: Negative.   Hematological: Negative.   Psychiatric/Behavioral:         Please refer to HPI    Blood pressure 118/76, pulse 69, height '5\' 5"'$  (1.651 m), weight 120 lb (54.4 kg).Body mass index is 19.97 kg/m.  General Appearance: Casual  Eye Contact:   Wearing tinted glasses  Speech:  Clear and Coherent, Normal Rate, and Talkative  Volume:  Normal  Mood:  Anxious and Depressed  Affect:  Congruent, Depressed, Full Range, Tearful, and Anxious  Thought Process:  Coherent, Goal Directed, Linear, and Descriptions of Associations: Intact  Orientation:  Full (Time, Place, and Person)  Thought Content: Logical, Hallucinations: None, and Rumination   Suicidal Thoughts:  No  Homicidal Thoughts:  No  Memory:  WNL  Judgement:  Good  Insight:  Good  Psychomotor Activity:  Normal  Concentration:  Concentration: Fair and Attention Span: Fair  Recall:  Good  Fund of Knowledge: Good  Language: Good  Assets:  Communication Skills Desire for Improvement Physical Health Resilience Social Support  ADL's:  Intact  Cognition: WNL  Prognosis:  Good   Screenings:  Flowsheet Row Admission (Discharged) from 09/25/2022 in [facility] from 02/26/2021 in [facility] at [place] No Risk Error: Question 6 not populated       Receiving  Psychotherapy: No   Treatment Plan/Recommendations: Pt seen for 75 minutes and time spent reviewing social, medical, and psychiatric history; as well as possible treatment options. Discussed that her symptoms are consistent with PTSD and depression. Discussed changing Prozac to Sertraline since she reports limited benefit with Prozac, and since Sertraline is indicated for PTSD. Discussed potential benefits, risks, and side effects of Sertraline. Pt agrees to trial of Sertraline. Will start Sertraline 25 mg po qd for 2-4 days, then 50 mg po qd for one week, then 100 mg po qd for anxiety and depression. Recommend continuing Wellbutrin XL 150 mg po qd for depression. Discussed potential benefits of therapy. Pt agrees to seeing a therapist and pt was assisted with scheduling an apt with a therapist. Pt to follow-up with this provider in 6 weeks or sooner if clinically indicated. Patient advised to contact office with any questions, adverse effects, or acute worsening in signs and symptoms.   Thayer Headings, PMHNP

## 2022-09-28 ENCOUNTER — Ambulatory Visit (INDEPENDENT_AMBULATORY_CARE_PROVIDER_SITE_OTHER): Payer: PPO | Admitting: Behavioral Health

## 2022-09-28 DIAGNOSIS — F1090 Alcohol use, unspecified, uncomplicated: Secondary | ICD-10-CM | POA: Diagnosis not present
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
## 2022-10-01 ENCOUNTER — Encounter: Payer: Self-pay | Admitting: Behavioral Health

## 2022-10-30 ENCOUNTER — Ambulatory Visit: Payer: PPO | Admitting: Psychiatry

## 2022-11-15 ENCOUNTER — Ambulatory Visit: Payer: PPO | Admitting: Psychiatry

## 2022-12-20 IMAGING — CT CT HEAD W/O CM
1 series · 16 of 30 positions shown, 20 images · non-contrast
Comparison: 08/21/2018

CLINICAL DATA: Remote trauma, headaches

EXAM:
CT HEAD WITHOUT CONTRAST
TECHNIQUE: Contiguous axial images were obtained from the base of the skull
through the vertex without intravenous contrast.

[Series 2: head wo · axial · 0.45mm/px · z∈[-90,+55]mm · 16 of 33 slices shown, 20 images]
[im 2/33  brain]
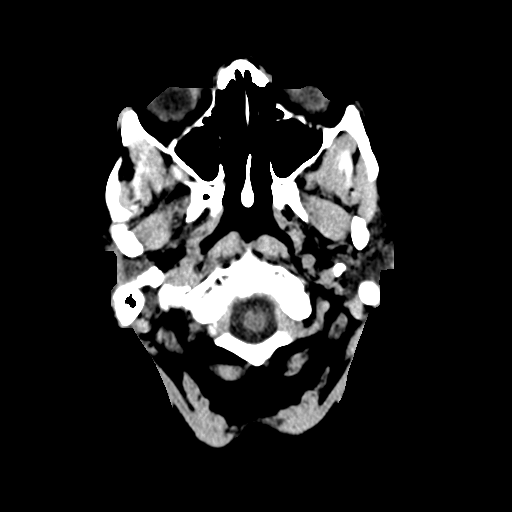
[im 2/33  bone]
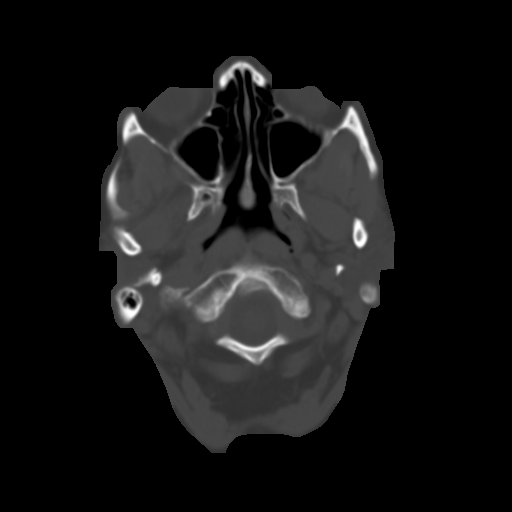
[im 4/33  brain]
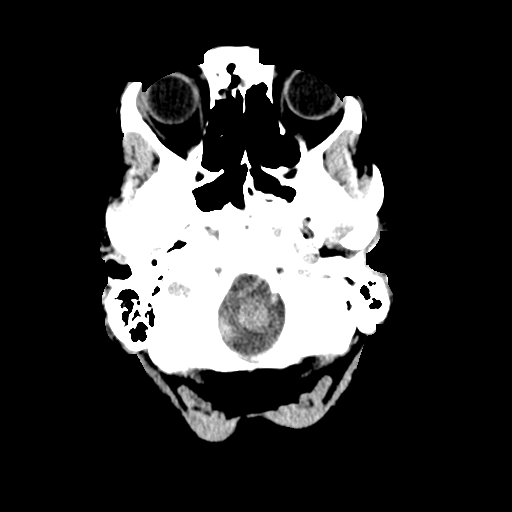
[im 6/33  brain]
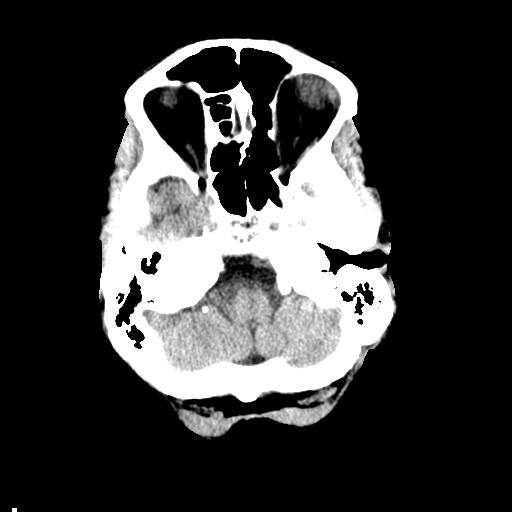
[im 8/33  brain]
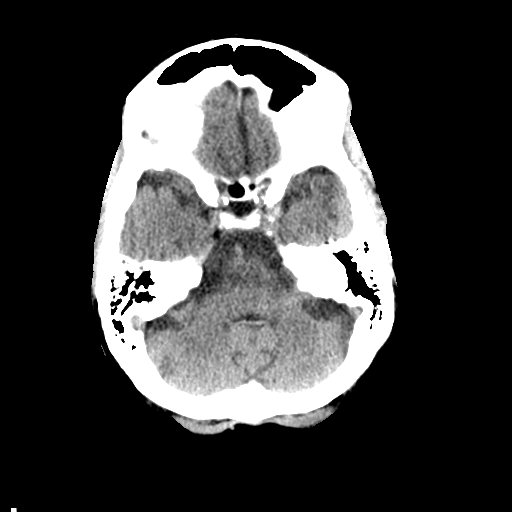
[im 9/33  brain]
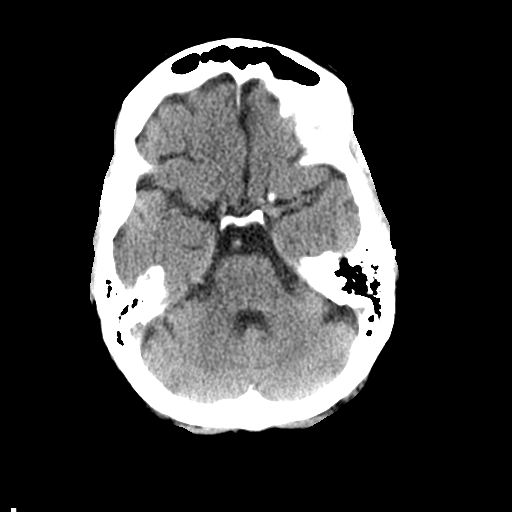
[im 9/33  bone]
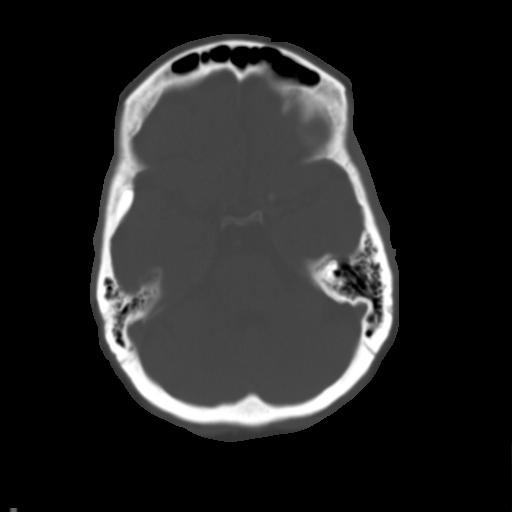
[im 12/33  brain]
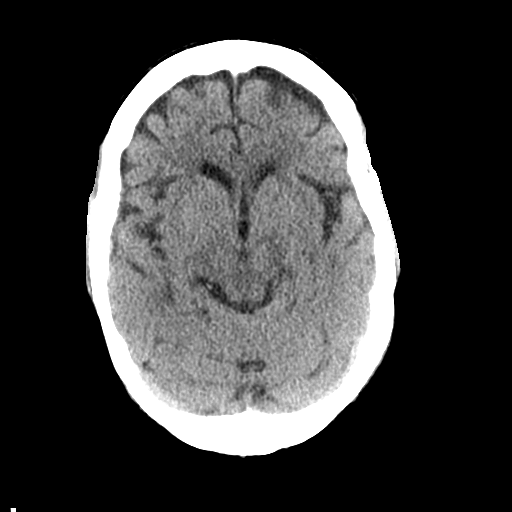
[im 14/33  brain]
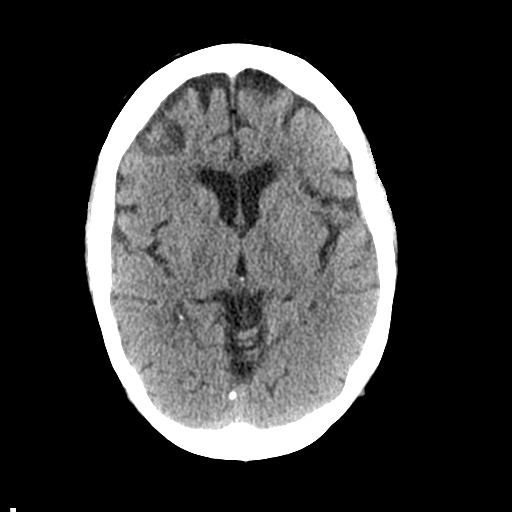
[im 16/33  brain]
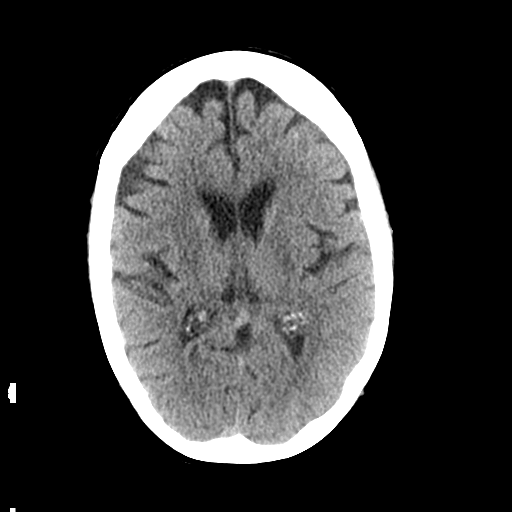
[im 17/33  brain]
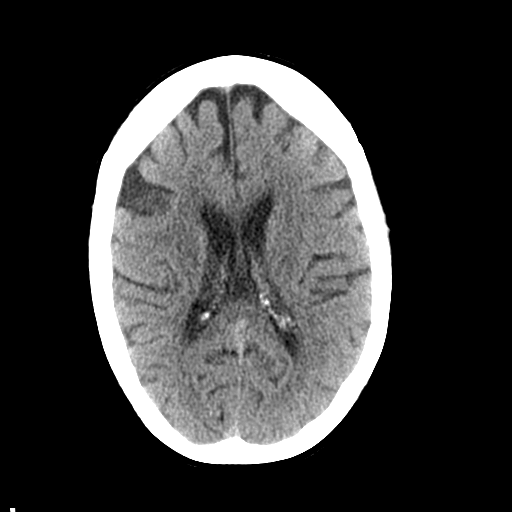
[im 17/33  bone]
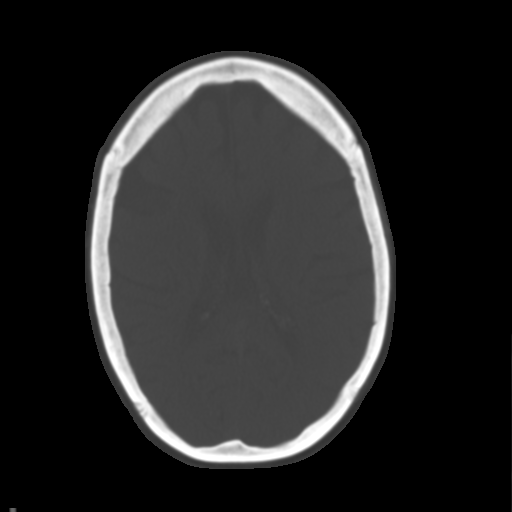
[im 19/33  brain]
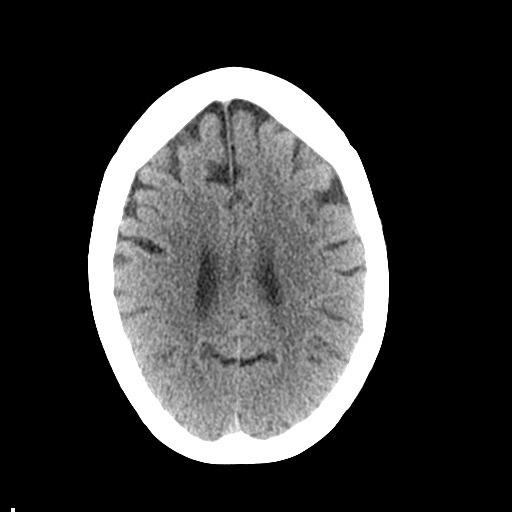
[im 21/33  brain]
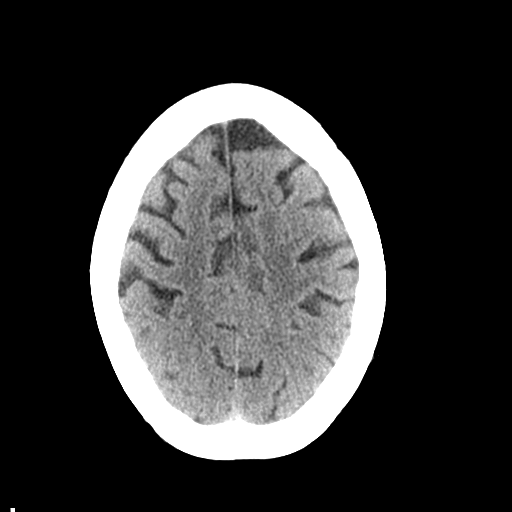
[im 24/33  brain]
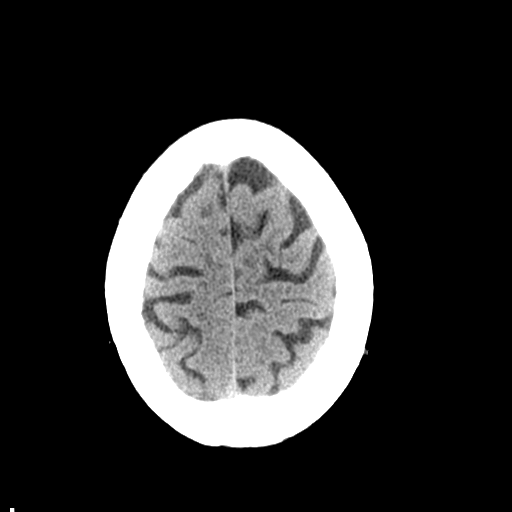
[im 25/33  brain]
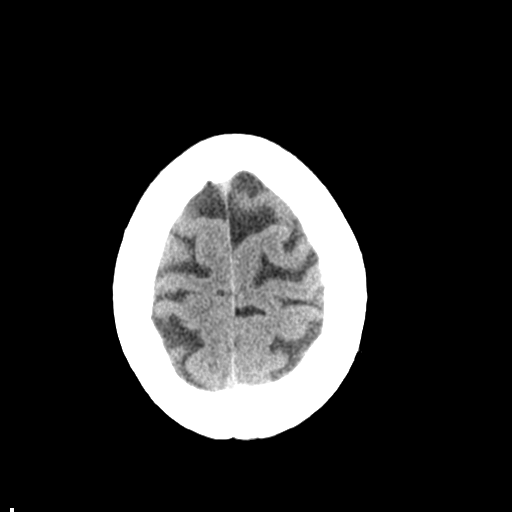
[im 25/33  bone]
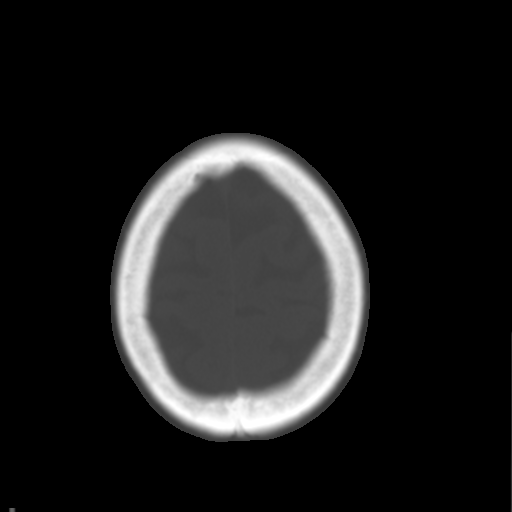
[im 27/33  brain]
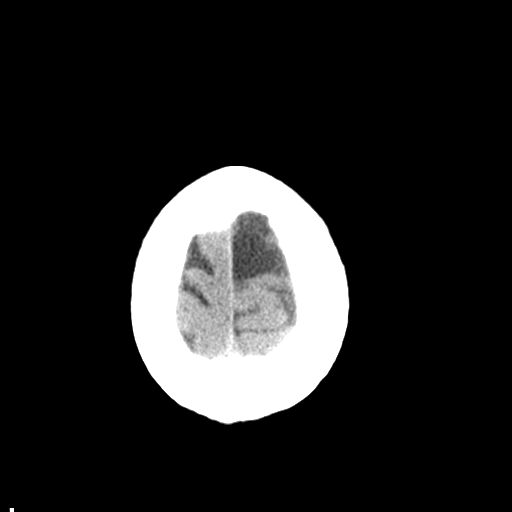
[im 29/33  brain]
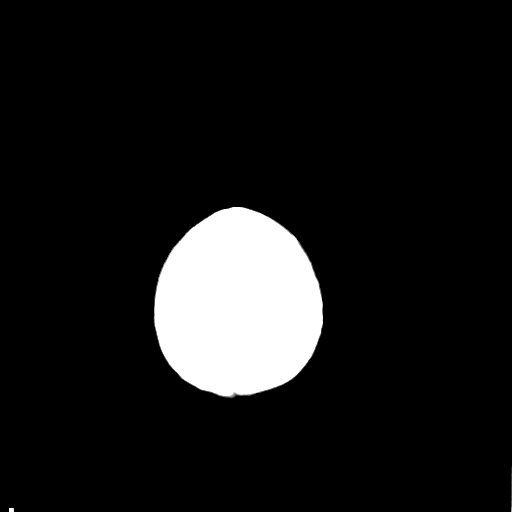
[im 31/33  brain]
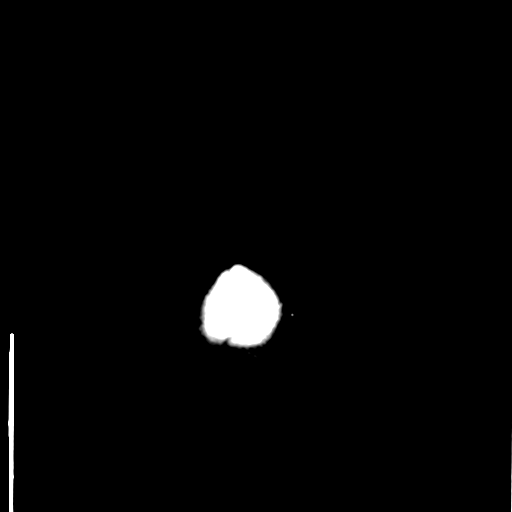

[16 of 30 positions shown; findings below may reference images not displayed]

FINDINGS: Brain: No evidence of acute infarction, hemorrhage, cerebral edema,
mass, mass effect, or midline shift. Ventricles and sulci are within
normal limits for age. No extra-axial fluid collection.

Vascular: No hyperdense vessel or unexpected calcification.

Skull: Normal. Negative for fracture or focal lesion.

Sinuses/Orbits: No acute finding.  Minimal mucosal thickening.

Other: The mastoid air cells are well aerated.
IMPRESSION: No acute intracranial process. No etiology is seen for the patient's
headaches.

## 2022-12-25 ENCOUNTER — Ambulatory Visit (INDEPENDENT_AMBULATORY_CARE_PROVIDER_SITE_OTHER): Payer: PPO | Admitting: Primary Care

## 2022-12-25 ENCOUNTER — Encounter: Payer: Self-pay | Admitting: Primary Care

## 2022-12-25 VITALS — BP 116/64 | HR 65 | Temp 97.7°F | Ht 65.0 in | Wt 126.0 lb

## 2022-12-25 DIAGNOSIS — I10 Essential (primary) hypertension: Secondary | ICD-10-CM | POA: Diagnosis not present

## 2022-12-25 DIAGNOSIS — J449 Chronic obstructive pulmonary disease, unspecified: Secondary | ICD-10-CM | POA: Diagnosis not present

## 2022-12-25 DIAGNOSIS — E785 Hyperlipidemia, unspecified: Secondary | ICD-10-CM | POA: Insufficient documentation

## 2022-12-25 DIAGNOSIS — N1831 Chronic kidney disease, stage 3a: Secondary | ICD-10-CM | POA: Diagnosis not present

## 2022-12-25 DIAGNOSIS — F32A Depression, unspecified: Secondary | ICD-10-CM

## 2022-12-25 DIAGNOSIS — F419 Anxiety disorder, unspecified: Secondary | ICD-10-CM

## 2022-12-25 DIAGNOSIS — N183 Chronic kidney disease, stage 3 unspecified: Secondary | ICD-10-CM | POA: Insufficient documentation

## 2022-12-25 MED ORDER — VALSARTAN 80 MG PO TABS: 80.0000 mg | ORAL_TABLET | Freq: Every day | ORAL | 0 refills | Status: DC

## 2022-12-25 NOTE — Assessment & Plan Note (Signed)
Reviewed prior labs in chart dating back several years.  Question if triamterene-HCTZ is contributing. She is not managed on ACE or ARB.  Stop triamterene-HCTZ.  Start valsartan 80 mg daily.  Continue atenolol 25 mg for now.  We will plan to see her back in the office in 2-3 weeks for BP check and BMP.

## 2022-12-25 NOTE — Assessment & Plan Note (Signed)
Following with psychiatry, reviewed office notes from January 2024.  Remain off Zoloft. Continue Wellbutrin XL 150 mg daily.  She will reconnect with her psychiatrist for ongoing treatment.

## 2022-12-25 NOTE — Patient Instructions (Signed)
Stop taking triamterene-hydrochlorothiazide for blood pressure.  Start taking valsartan 80 mgm once daily for blood pressure. Continue taking atenolol for now.  Please schedule a follow up visit to meet back with me in 2-3 weeks for blood pressure check.   It was a pleasure to meet you today! Please don't hesitate to contact me with any questions. Welcome to [facility]!

## 2022-12-25 NOTE — Progress Notes (Signed)
Subjective:    Patient ID: Tabitha Miller, female    DOB: Oct 26, 1945, 77 y.o.   MRN: [id number]  HPI  KOLBI TOFTE is a very pleasant 77 y.o. female with a history of hyperlipidemia, anxiety and depression who presents today   1) Anxiety and Depression/PTSD: Following with psychiatry at [facility], initial visit was 09/26/22. During this visit sertraline 25 mg was added for a few days with plans of titration to 50 mg. Her Wellbutrin XL 150 mg was continued. She was referred for therapy.   She is no longer taking Zoloft as it caused poor side effects "felt weird". She did meet with therapy once, was contemplating inpatient treatment but never proceeded. She has not seen her psychiatrist.   2) Hyperlipidemia: Currently managed on atorvastatin 10 mg daily. Lipid panel from July 2023 with LDL of 55.   3) Essential Hypertension: Currently managed on triamterene-HCTZ 37.5-25 mg daily and atenolol 25 mg HS. She denies headaches, dizziness.   BP Readings from Last 3 Encounters:  12/25/22 116/64  09/26/22 118/76  09/25/22 118/76   4) COPD: Chronic history of tobacco abuse. She currently smokes about 1/2 PPD. She doesn't inhale much. She denies exertional dyspnea or any problems secondary to long term tobacco use.   Review of Systems  Respiratory:  Negative for shortness of breath.   Cardiovascular:  Negative for chest pain.  Genitourinary:  Negative for difficulty urinating.  Neurological:  Negative for dizziness.  Psychiatric/Behavioral:  The patient is nervous/anxious.          Past Medical History:  Diagnosis Date   Chickenpox    COPD (chronic obstructive pulmonary disease) (HCC)    Heart palpitations    Hyperlipidemia    Osteoarthritis (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)    fingers, shoulders, knees   Vertigo approx 01/16   x1    Social History   Socioeconomic History   Marital status: Married    Spouse name: Not on file   Number of children: Not on file    Years of education: Not on file   Highest education level: Not on file  Occupational History   Not on file  Tobacco Use   Smoking status: Every Day    Packs/day: 0.50    Years: 30.00    Additional pack years: 0.00    Total pack years: 15.00    Types: Cigarettes   Smokeless tobacco: Never  Vaping Use   Vaping Use: Never used  Substance and Sexual Activity   Alcohol use: Yes    Comment: 2 drinks a night   Drug use: No   Sexual activity: Not on file  Other Topics Concern   Not on file  Social History Narrative   Not on file   Social Determinants of Health   Financial Resource Strain: Not on file  Food Insecurity: Not on file  Transportation Needs: Not on file  Physical Activity: Not on file  Stress: Not on file  Social Connections: Not on file  Intimate Partner Violence: Not on file    Past Surgical History:  Procedure Laterality Date   APPENDECTOMY     BREAST EXCISIONAL BIOPSY Right 1995   neg   BUNIONECTOMY Bilateral    COLONOSCOPY     COLONOSCOPY WITH PROPOFOL N/A 09/25/2022   Procedure: COLONOSCOPY WITH PROPOFOL;  Surgeon: Regis Bill, MD;  Location: [facility];  Service: Endoscopy;  Laterality: N/A;   EXCISION PARTIAL PHALANX Right 03/08/2016   Procedure: EXCISION  PARTIAL PHALANX 5TH RT TOE;  Surgeon: Recardo Evangelist, DPM;  Location: [facility];  Service: Podiatry;  Laterality: Right;  IVA WITH LOCAL   JOINT REPLACEMENT     STERIOD INJECTION Right 03/08/2016   Procedure: STEROID INJECTION;  Surgeon: Recardo Evangelist, DPM;  Location: [facility];  Service: Podiatry;  Laterality: Right;   TOE SURGERY Left    fractured 2nd toe   TOTAL HIP ARTHROPLASTY Left 06/13/14   TOTAL KNEE ARTHROPLASTY Bilateral 10/08/2017   Procedure: TOTAL KNEE BILATERAL;  Surgeon: Kennedy Bucker, MD;  Location: [facility];  Service: Orthopedics;  Laterality: Bilateral;    Family History  Problem Relation Age of Onset   Anxiety disorder Mother    Breast cancer  Mother 15   Congestive Heart Failure Mother    Melanoma Father    Heart disease Father    Breast cancer Sister 19   Breast cancer Maternal Aunt 42    Allergies  Allergen Reactions   Egg White (Egg Protein) Swelling   Egg-Derived Products    Other Other (See Comments)    Permanent hair color - severe scalp break out, outside ear infection, scabs    Current Outpatient Medications on File Prior to Visit  Medication Sig Dispense Refill   ascorbic acid (VITAMIN C) 500 MG tablet Take by mouth.     aspirin 81 MG tablet Take 81 mg by mouth daily.     atenolol (TENORMIN) 25 MG tablet Take 25 mg by mouth at bedtime.      atorvastatin (LIPITOR) 10 MG tablet Take 10 mg by mouth at bedtime.      Bioflavonoid Products (GRAPE SEED PO) Take 1 capsule by mouth at bedtime.      buPROPion (WELLBUTRIN XL) 150 MG 24 hr tablet Take 1 tablet by mouth daily.     Calcium-Magnesium-Vitamin D (CALCIUM MAGNESIUM PO) Take by mouth.     Chelated Zinc 50 MG TABS Take 1 tablet by mouth at bedtime.     chlorhexidine (PERIDEX) 0.12 % solution Use as directed 15 mLs in the mouth or throat as needed.     Cholecalciferol (VITAMIN D3) 10 MCG (400 UNIT) tablet Take by mouth.     Coenzyme Q10 (CO Q 10) 100 MG CAPS Take 1 capsule by mouth daily.     FENUGREEK PO Take 2 tablets by mouth daily. Fenugreek and thyme 350 mg/150mg      GARLIC PO Take by mouth.     Ginkgo Biloba 120 MG CAPS Take 1 capsule by mouth daily.     Green Tea, Camillia sinensis, (GREEN TEA EXTRACT PO) Take 1 tablet by mouth at bedtime.      Multiple Vitamins-Minerals (WOMENS MULTIVITAMIN PO) Take 1 tablet by mouth at bedtime.     vitamin E 400 UNIT capsule Take 400 Units by mouth at bedtime.      clobetasol cream (TEMOVATE) 0.05 % Apply 1 application topically as needed. (Patient not taking: Reported on 12/25/2022)     No current facility-administered medications on file prior to visit.    BP 116/64   Pulse 65   Temp 97.7 F (36.5 C) (Temporal)    Ht 5\' 5"  (1.651 m)   Wt 126 lb (57.2 kg)   SpO2 98%   BMI 20.97 kg/m  Objective:   Physical Exam Cardiovascular:     Rate and Rhythm: Normal rate and regular rhythm.  Pulmonary:     Effort: Pulmonary effort is normal.     Breath sounds: Normal breath sounds.  Musculoskeletal:     Cervical back: Neck supple.  Skin:    General: Skin is warm and dry.  Neurological:     Mental Status: She is alert and oriented to person, place, and time.  Psychiatric:        Mood and Affect: Mood normal.           Assessment & Plan:  Essential hypertension Assessment & Plan: Controlled although she does have a history of CKD noted in her chart.  Stop triamterene-HCTZ.  Start valsartan 80 mg daily.  Continue atenolol 25 mg for now.  We will plan to see her back in the office in 2-3 weeks for BP check and BMP.  Orders: -     Valsartan; Take 1 tablet (80 mg total) by mouth daily. for blood pressure.  Dispense: 30 tablet; Refill: 0  Hyperlipidemia, unspecified hyperlipidemia type Assessment & Plan: Continue atorvastatin 10 mg daily. Reviewed lipid panel from July 2023.   Stage 3a chronic kidney disease (HCC) Assessment & Plan: Reviewed prior labs in chart dating back several years.  Question if triamterene-HCTZ is contributing. She is not managed on ACE or ARB.  Stop triamterene-HCTZ.  Start valsartan 80 mg daily.  Continue atenolol 25 mg for now.  We will plan to see her back in the office in 2-3 weeks for BP check and BMP.   Anxiety and depression Assessment & Plan: Following with psychiatry, reviewed office notes from January 2024.  Remain off Zoloft. Continue Wellbutrin XL 150 mg daily.  She will reconnect with her psychiatrist for ongoing treatment.   Chronic obstructive pulmonary disease, unspecified COPD type [facility]) Assessment & Plan: Noted on chest xray from October 2023.  Continue to monitor.          Doreene Nest, NP

## 2022-12-25 NOTE — Assessment & Plan Note (Signed)
Continue atorvastatin 10 mg daily. Reviewed lipid panel from July 2023. 

## 2022-12-25 NOTE — Assessment & Plan Note (Signed)
Noted on chest xray from October 2023.  Continue to monitor.

## 2022-12-25 NOTE — Assessment & Plan Note (Addendum)
Controlled although she does have a history of CKD noted in her chart.  Stop triamterene-HCTZ.  Start valsartan 80 mg daily.  Continue atenolol 25 mg for now.  We will plan to see her back in the office in 2-3 weeks for BP check and BMP.

## 2023-01-01 ENCOUNTER — Other Ambulatory Visit: Payer: Self-pay | Admitting: Primary Care

## 2023-01-01 DIAGNOSIS — I10 Essential (primary) hypertension: Secondary | ICD-10-CM

## 2023-01-08 ENCOUNTER — Ambulatory Visit (INDEPENDENT_AMBULATORY_CARE_PROVIDER_SITE_OTHER): Payer: PPO | Admitting: Primary Care

## 2023-01-08 ENCOUNTER — Ambulatory Visit (INDEPENDENT_AMBULATORY_CARE_PROVIDER_SITE_OTHER)
Admission: RE | Admit: 2023-01-08 | Discharge: 2023-01-08 | Disposition: A | Discharge status: Home / Self Care | Payer: PPO | Source: Ambulatory Visit | Attending: Primary Care | Admitting: Primary Care

## 2023-01-08 ENCOUNTER — Encounter: Payer: Self-pay | Admitting: Primary Care

## 2023-01-08 VITALS — BP 114/68 | HR 67 | Temp 97.5°F | Ht 65.0 in | Wt 127.0 lb

## 2023-01-08 DIAGNOSIS — G8929 Other chronic pain: Secondary | ICD-10-CM

## 2023-01-08 DIAGNOSIS — R202 Paresthesia of skin: Secondary | ICD-10-CM

## 2023-01-08 DIAGNOSIS — M19011 Primary osteoarthritis, right shoulder: Secondary | ICD-10-CM | POA: Diagnosis not present

## 2023-01-08 DIAGNOSIS — M25511 Pain in right shoulder: Secondary | ICD-10-CM

## 2023-01-08 DIAGNOSIS — M19012 Primary osteoarthritis, left shoulder: Secondary | ICD-10-CM | POA: Diagnosis not present

## 2023-01-08 DIAGNOSIS — M25512 Pain in left shoulder: Secondary | ICD-10-CM

## 2023-01-08 DIAGNOSIS — I10 Essential (primary) hypertension: Secondary | ICD-10-CM

## 2023-01-08 DIAGNOSIS — N1831 Chronic kidney disease, stage 3a: Secondary | ICD-10-CM | POA: Diagnosis not present

## 2023-01-08 LAB — BASIC METABOLIC PANEL
BUN: 33 mg/dL — ABNORMAL HIGH (ref 6–23)
CO2: 25 mEq/L (ref 19–32)
Calcium: 9.9 mg/dL (ref 8.4–10.5)
Chloride: 102 mEq/L (ref 96–112)
Creatinine, Ser: 1.17 mg/dL (ref 0.40–1.20)
GFR: 45.84 mL/min — ABNORMAL LOW (ref >60.00)
Glucose, Bld: 91 mg/dL (ref 70–99)
Potassium: 3.9 mEq/L (ref 3.5–5.1)
Sodium: 136 mEq/L (ref 135–145)

## 2023-01-08 MED ORDER — PREDNISONE 20 MG PO TABS: ORAL_TABLET | ORAL | 0 refills | Status: DC

## 2023-01-08 NOTE — Assessment & Plan Note (Signed)
Controlled!  Continue valsartan 80 mg. Discussed discontinuation of atenolol 25 mg as she doesn't have a clear indication to continue.   She will discontinue atenolol and monitor BP at home.  BMP pending.

## 2023-01-08 NOTE — Assessment & Plan Note (Signed)
Likely secondary to osteoarthritis, also causing paresthesias.  May need steroid injections vs PT.  Plain films ordered and pending today. She will set up a visit with sports medicine.   Start prednisone tablets. Take two tablets my mouth once daily in the morning for four days, then one tablet once daily in the morning for four days.

## 2023-01-08 NOTE — Patient Instructions (Signed)
Stop by the lab prior to leaving today. I will notify you of your results once received.   Start prednisone tablets. Take two tablets my mouth once daily in the morning for four days, then one tablet once daily in the morning for four days.   Stop atenolol 25 mg for blood pressure.  Monitor your blood pressure as discussed.  Schedule a visit with Dr. Patsy Lager for evaluation of your shoulders.  It was a pleasure to see you today!

## 2023-01-08 NOTE — Assessment & Plan Note (Addendum)
Secondary to osteoarthritis of the shoulders  May need steroid injections vs PT.  Plain films ordered and pending today. She will set up a visit with sports medicine.   Start prednisone tablets. Take two tablets my mouth once daily in the morning for four days, then one tablet once daily in the morning for four days.

## 2023-01-08 NOTE — Progress Notes (Signed)
Subjective:    Patient ID: Tabitha Miller, female    DOB: June 18, 1946, 77 y.o.   MRN: [id number]  HPI  Tabitha Miller is a very pleasant 77 y.o. female with a history of hypertension, COPD, CKD, hyperlipidemia who presents today for follow up of hypertension. She would also like to discuss paresthesias.   1) Hypertension: She was last evaluated on 12/25/22 as a new patient. Blood pressure was controlled, but given her CKD, we discontinued triamterene-HCTZ and initiated valsartan 80 mg daily. We continued her atenolol at the time.  Since her last visit she is compliant to valsartan 80 mg daily and atenolol 25 mg daily.    BP Readings from Last 3 Encounters:  01/08/23 114/68  12/25/22 116/64  09/25/22 118/76    2) Paresthesias: Chronic to the bilateral upper extremities for years, intermittent. Her symptoms historically occurred at night, but now she's noticed symptoms with driving and doing household tasks. History of osteoarthritis to bilateral shoulders with reduction of range of motion. Her numbness occurs from the anterior shoulders down to her fingers.   She underwent CT left shoulder in July 2022 which showed mildly impacted surgical neck fracture to left humerus. She did not undergo surgery at the time as it was too late. Her shoulder pain has resolved, but she was told that she has "bone on bone".   Review of Systems  Musculoskeletal:  Positive for arthralgias.  Neurological:  Positive for numbness. Negative for dizziness.         Past Medical History:  Diagnosis Date   Chickenpox    COPD (chronic obstructive pulmonary disease) (HCC)    Heart palpitations    Hyperlipidemia    Osteoarthritis (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)    fingers, shoulders, knees   Vertigo approx 01/16   x1    Social History   Socioeconomic History   Marital status: Married    Spouse name: Not on file   Number of children: Not on file   Years of education: Not on file   Highest  education level: Not on file  Occupational History   Not on file  Tobacco Use   Smoking status: Every Day    Packs/day: 0.50    Years: 30.00    Additional pack years: 0.00    Total pack years: 15.00    Types: Cigarettes   Smokeless tobacco: Never  Vaping Use   Vaping Use: Never used  Substance and Sexual Activity   Alcohol use: Yes    Comment: 2 drinks a night   Drug use: No   Sexual activity: Not on file  Other Topics Concern   Not on file  Social History Narrative   Not on file   Social Determinants of Health   Financial Resource Strain: Not on file  Food Insecurity: Not on file  Transportation Needs: Not on file  Physical Activity: Not on file  Stress: Not on file  Social Connections: Not on file  Intimate Partner Violence: Not on file    Past Surgical History:  Procedure Laterality Date   APPENDECTOMY     BREAST EXCISIONAL BIOPSY Right 1995   neg   BUNIONECTOMY Bilateral    COLONOSCOPY     COLONOSCOPY WITH PROPOFOL N/A 09/25/2022   Procedure: COLONOSCOPY WITH PROPOFOL;  Surgeon: Regis Bill, MD;  Location: [facility];  Service: Endoscopy;  Laterality: N/A;   EXCISION PARTIAL PHALANX Right 03/08/2016   Procedure: EXCISION PARTIAL PHALANX 5TH RT TOE;  Surgeon: Recardo Evangelist, DPM;  Location: [facility];  Service: Podiatry;  Laterality: Right;  IVA WITH LOCAL   JOINT REPLACEMENT     STERIOD INJECTION Right 03/08/2016   Procedure: STEROID INJECTION;  Surgeon: Recardo Evangelist, DPM;  Location: [facility];  Service: Podiatry;  Laterality: Right;   TOE SURGERY Left    fractured 2nd toe   TOTAL HIP ARTHROPLASTY Left 06/13/14   TOTAL KNEE ARTHROPLASTY Bilateral 10/08/2017   Procedure: TOTAL KNEE BILATERAL;  Surgeon: Kennedy Bucker, MD;  Location: [facility];  Service: Orthopedics;  Laterality: Bilateral;    Family History  Problem Relation Age of Onset   Anxiety disorder Mother    Breast cancer Mother 12   Congestive Heart Failure Mother     Melanoma Father    Heart disease Father    Breast cancer Sister 79   Breast cancer Maternal Aunt 5    Allergies  Allergen Reactions   Egg White (Egg Protein) Swelling   Egg-Derived Products    Other Other (See Comments)    Permanent hair color - severe scalp break out, outside ear infection, scabs    Current Outpatient Medications on File Prior to Visit  Medication Sig Dispense Refill   ascorbic acid (VITAMIN C) 500 MG tablet Take by mouth.     aspirin 81 MG tablet Take 81 mg by mouth daily.     atenolol (TENORMIN) 25 MG tablet Take 25 mg by mouth at bedtime.      atorvastatin (LIPITOR) 10 MG tablet Take 10 mg by mouth at bedtime.      Bioflavonoid Products (GRAPE SEED PO) Take 1 capsule by mouth at bedtime.      buPROPion (WELLBUTRIN XL) 150 MG 24 hr tablet Take 1 tablet by mouth daily.     Calcium-Magnesium-Vitamin D (CALCIUM MAGNESIUM PO) Take by mouth.     Chelated Zinc 50 MG TABS Take 1 tablet by mouth at bedtime.     chlorhexidine (PERIDEX) 0.12 % solution Use as directed 15 mLs in the mouth or throat as needed.     Cholecalciferol (VITAMIN D3) 10 MCG (400 UNIT) tablet Take by mouth.     Coenzyme Q10 (CO Q 10) 100 MG CAPS Take 1 capsule by mouth daily.     FENUGREEK PO Take 2 tablets by mouth daily. Fenugreek and thyme 350 mg/150mg      GARLIC PO Take by mouth.     Ginkgo Biloba 120 MG CAPS Take 1 capsule by mouth daily.     Green Tea, Camillia sinensis, (GREEN TEA EXTRACT PO) Take 1 tablet by mouth at bedtime.      Multiple Vitamins-Minerals (WOMENS MULTIVITAMIN PO) Take 1 tablet by mouth at bedtime.     valsartan (DIOVAN) 80 MG tablet Take 1 tablet (80 mg total) by mouth daily. for blood pressure. 30 tablet 0   vitamin E 400 UNIT capsule Take 400 Units by mouth at bedtime.      clobetasol cream (TEMOVATE) 0.05 % Apply 1 application topically as needed. (Patient not taking: Reported on 12/25/2022)     No current facility-administered medications on file prior to visit.     BP 114/68   Pulse 67   Temp (!) 97.5 F (36.4 C) (Temporal)   Ht 5\' 5"  (1.651 m)   Wt 127 lb (57.6 kg)   SpO2 100%   BMI 21.13 kg/m  Objective:   Physical Exam Cardiovascular:     Rate and Rhythm: Normal rate and regular rhythm.  Pulmonary:  Effort: Pulmonary effort is normal.     Breath sounds: Normal breath sounds.  Musculoskeletal:     Right shoulder: Decreased range of motion. Normal strength.     Left shoulder: Decreased range of motion. Normal strength.     Cervical back: Neck supple.     Comments: Decrease in ROM with pain to both shoulders with posterior abduction, lateral and anterior abduction.   Negative empty can test.  Skin:    General: Skin is warm and dry.           Assessment & Plan:  Chronic pain of both shoulders Assessment & Plan: Likely secondary to osteoarthritis, also causing paresthesias.  May need steroid injections vs PT.  Plain films ordered and pending today. She will set up a visit with sports medicine.   Start prednisone tablets. Take two tablets my mouth once daily in the morning for four days, then one tablet once daily in the morning for four days.    Orders: -     DG Shoulder Right -     DG Shoulder Left  Paresthesias Assessment & Plan: Secondary to osteoarthritis of the shoulders  May need steroid injections vs PT.  Plain films ordered and pending today. She will set up a visit with sports medicine.   Start prednisone tablets. Take two tablets my mouth once daily in the morning for four days, then one tablet once daily in the morning for four days.   Orders: -     DG Shoulder Right -     DG Shoulder Left -     predniSONE; Take two tablets my mouth once daily in the morning for four days, then one tablet once daily in the morning for four days.  Dispense: 12 tablet; Refill: 0  Essential hypertension Assessment & Plan: Controlled!  Continue valsartan 80 mg. Discussed discontinuation of atenolol 25 mg as she  doesn't have a clear indication to continue.   She will discontinue atenolol and monitor BP at home.  BMP pending.   Orders: -     Basic metabolic panel  Stage 3a chronic kidney disease (HCC) Assessment & Plan: Now managed on ARB.  Repeat renal function pending.         Doreene Nest, NP

## 2023-01-08 NOTE — Assessment & Plan Note (Signed)
Now managed on ARB.  Repeat renal function pending.

## 2023-01-10 ENCOUNTER — Ambulatory Visit: Payer: PPO | Admitting: Family Medicine

## 2023-01-15 NOTE — Progress Notes (Signed)
    Lashonta Pilling T. Juergen Hardenbrook, MD, CAQ Sports Medicine [facility] [address] [place], [address]  Phone: [phone]  FAX: [phone]  ANALEA HAGMAN - 77 y.o. female  MRN [id number]  Date of Birth: Mar 29, 1946  Date: 01/16/2023  PCP: Doreene Nest, NP  Referral: Doreene Nest, NP  No chief complaint on file.  Subjective:   RAMESHA FRISQUE is a 77 y.o. very pleasant female patient with There is no height or weight on file to calculate BMI. who presents with the following:  Patient presents with bilateral shoulder pain.  She saw Mrs. Clark last week, and plain x-rays were obtained.  She was also given a round of some oral steroids.  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  This is notable on the right and the left. -She also does have a deformity at the left humeral head, and she has a known prior impacted proximal humerus and surgical neck fracture of the humerus that was managed nonoperatively in 2022 by orthopedics.    Review of Systems is noted in the HPI, as appropriate  Objective:   There were no vitals taken for this visit.  GEN: No acute distress; alert,appropriate. PULM: Breathing comfortably in no respiratory distress PSYCH: Normally interactive.   Laboratory and Imaging Data:  Assessment and Plan:   ***

## 2023-01-16 ENCOUNTER — Ambulatory Visit (INDEPENDENT_AMBULATORY_CARE_PROVIDER_SITE_OTHER): Payer: PPO | Admitting: Family Medicine

## 2023-01-16 ENCOUNTER — Encounter: Payer: Self-pay | Admitting: Family Medicine

## 2023-01-16 VITALS — BP 120/60 | HR 83 | Temp 98.3°F | Ht 65.0 in | Wt 127.4 lb

## 2023-01-16 DIAGNOSIS — M19012 Primary osteoarthritis, left shoulder: Secondary | ICD-10-CM | POA: Diagnosis not present

## 2023-01-16 DIAGNOSIS — M19011 Primary osteoarthritis, right shoulder: Secondary | ICD-10-CM

## 2023-01-16 MED ORDER — CELECOXIB 200 MG PO CAPS: 200.0000 mg | ORAL_CAPSULE | Freq: Every day | ORAL | 2 refills | Status: AC

## 2023-01-28 ENCOUNTER — Other Ambulatory Visit: Payer: Self-pay | Admitting: Primary Care

## 2023-01-28 DIAGNOSIS — I10 Essential (primary) hypertension: Secondary | ICD-10-CM

## 2023-03-04 ENCOUNTER — Telehealth: Payer: Self-pay | Admitting: Primary Care

## 2023-03-04 NOTE — Telephone Encounter (Signed)
Please call patient or okay to send her a MyChart message with this information. Not sure if she uses my chart.  You can try a few things over the counter to help with your symptoms including:  Cough: Delsym or Robitussin (get the off brand, works just as well) Chest Congestion: Mucinex (plain) Nasal Congestion/Ear Pressure/Sinus Pressure: Try using Flonase (fluticasone) nasal spray. Instill 1 spray in each nostril twice daily. This can be purchased over the counter. Body aches, fevers, headache: Ibuprofen (not to exceed 2400 mg in 24 hours) or Acetaminophen-Tylenol (not to exceed 3000 mg in 24 hours) Runny Nose/Throat Drainage/Sneezing/Itchy or Watery Eyes: An antihistamine such as Zyrtec, Claritin, Xyzal, Allegra

## 2023-03-04 NOTE — Telephone Encounter (Signed)
Called patient and reviewed all information. Patient verbalized understanding. Will call if any further questions.  

## 2023-03-04 NOTE — Telephone Encounter (Signed)
Patient would like any recommendation on any medication that she can get OTC for congestion,headache,and ear pain? She refused to come in the office to be evaluated.

## 2023-03-19 ENCOUNTER — Ambulatory Visit: Payer: PPO

## 2023-03-22 ENCOUNTER — Other Ambulatory Visit: Payer: Self-pay | Admitting: Primary Care

## 2023-03-22 DIAGNOSIS — Z1231 Encounter for screening mammogram for malignant neoplasm of breast: Secondary | ICD-10-CM

## 2023-04-02 ENCOUNTER — Ambulatory Visit: Admission: RE | Admit: 2023-04-02 | Payer: PPO | Source: Home / Self Care

## 2023-04-02 ENCOUNTER — Encounter: Admission: RE | Payer: Self-pay | Source: Home / Self Care

## 2023-04-02 SURGERY — COLONOSCOPY WITH PROPOFOL
Anesthesia: General

## 2023-04-04 ENCOUNTER — Ambulatory Visit (INDEPENDENT_AMBULATORY_CARE_PROVIDER_SITE_OTHER): Payer: PPO

## 2023-04-04 VITALS — BP 120/60 | Ht 65.0 in | Wt 124.0 lb

## 2023-04-04 DIAGNOSIS — Z0100 Encounter for examination of eyes and vision without abnormal findings: Secondary | ICD-10-CM

## 2023-04-04 DIAGNOSIS — Z1159 Encounter for screening for other viral diseases: Secondary | ICD-10-CM

## 2023-04-04 DIAGNOSIS — R6889 Other general symptoms and signs: Secondary | ICD-10-CM

## 2023-04-04 DIAGNOSIS — Z0000 Encounter for general adult medical examination without abnormal findings: Secondary | ICD-10-CM | POA: Diagnosis not present

## 2023-04-04 NOTE — Patient Instructions (Signed)
Ms. Tabitha Miller , Thank you for taking time to come for your Medicare Wellness Visit. I appreciate your ongoing commitment to your health goals. Please review the following plan we discussed and let me know if I can assist you in the future.   These are the goals we discussed:  Goals      Patient Stated     Work on increasing energy level and decreasing stress        This is a list of the screening recommended for you and due dates:  Health Maintenance  Topic Date Due   COVID-19 Vaccine (1) Never done   Pneumonia Vaccine (1 of 2 - PCV) Never done   Hepatitis C Screening  Never done   DTaP/Tdap/Td vaccine (1 - Tdap) Never done   Flu Shot  03/28/2023   Medicare Annual Wellness Visit  04/03/2024   Colon Cancer Screening  09/25/2032   DEXA scan (bone density measurement)  Completed   Zoster (Shingles) Vaccine  Completed   HPV Vaccine  Aged Out    Advanced directives: Advance directive discussed with you today. Even though you declined this today, please call our office should you change your mind, and we can give you the proper paperwork for you to fill out. Advance care planning is a way to make decisions about medical care that fits your values in case you are ever unable to make these decisions for yourself.  Information on Advanced Care Planning can be found at [facility] Advance Health Care Directives Advance Health Care Directives ([email or web address]    Conditions/risks identified: Steps to Quit Smoking Smoking tobacco is the leading cause of preventable death. It can affect almost every organ in the body. Smoking puts you and people around you at risk for many serious, long-lasting (chronic) diseases. Quitting smoking can be hard, but it is one of the best things that you can do for your health. It is never too late to quit. Do not give up if you cannot quit the first time. Some people need to try many times to quit. Do your best to stick to your quit plan, and talk  with your doctor if you have any questions or concerns. How do I get ready to quit? Pick a date to quit. Set a date within the next 2 weeks to give you time to prepare. Write down the reasons why you are quitting. Keep this list in places where you will see it often. Tell your family, friends, and co-workers that you are quitting. Their support is important. Talk with your doctor about the choices that may help you quit. Find out if your health insurance will pay for these treatments. Know the people, places, things, and activities that make you want to smoke (triggers). Avoid them. What first steps can I take to quit smoking? Throw away all cigarettes at home, at work, and in your car. Throw away the things that you use when you smoke, such as ashtrays and lighters. Clean your car. Empty the ashtray. Clean your home, including curtains and carpets. What can I do to help me quit smoking? Talk with your doctor about taking medicines and seeing a counselor. You are more likely to succeed when you do both. If you are pregnant or breastfeeding: Talk with your doctor about counseling or other ways to quit smoking. Do not take medicine to help you quit smoking unless your doctor tells you to. Quit right away Quit smoking completely, instead of slowly cutting  back on how much you smoke over a period of time. Stopping smoking right away may be more successful than slowly quitting. Go to counseling. In-person is best if this is an option. You are more likely to quit if you go to counseling sessions regularly. Take medicine You may take medicines to help you quit. Some medicines need a prescription, and some you can buy over-the-counter. Some medicines may contain a drug called nicotine to replace the nicotine in cigarettes. Medicines may: Help you stop having the desire to smoke (cravings). Help to stop the problems that come when you stop smoking (withdrawal symptoms). Your doctor may ask you to  use: Nicotine patches, gum, or lozenges. Nicotine inhalers or sprays. Non-nicotine medicine that you take by mouth. Find resources Find resources and other ways to help you quit smoking and remain smoke-free after you quit. They include: Online chats with a Veterinary surgeon. Phone quitlines. Printed Materials engineer. Support groups or group counseling. Text messaging programs. Mobile phone apps. Use apps on your mobile phone or tablet that can help you stick to your quit plan. Examples of free services include Quit Guide from the CDC and smokefree.gov  What can I do to make it easier to quit?  Talk to your family and friends. Ask them to support and encourage you. Call a phone quitline, such as 1-800-QUIT-NOW, reach out to support groups, or work with a Veterinary surgeon. Ask people who smoke to not smoke around you. Avoid places that make you want to smoke, such as: Bars. Parties. Smoke-break areas at work. Spend time with people who do not smoke. Lower the stress in your life. Stress can make you want to smoke. Try these things to lower stress: Getting regular exercise. Doing deep-breathing exercises. Doing yoga. Meditating. What benefits will I see if I quit smoking? Over time, you may have: A better sense of smell and taste. Less coughing and sore throat. A slower heart rate. Lower blood pressure. Clearer skin. Better breathing. Fewer sick days. Summary Quitting smoking can be hard, but it is one of the best things that you can do for your health. Do not give up if you cannot quit the first time. Some people need to try many times to quit. When you decide to quit smoking, make a plan to help you succeed. Quit smoking right away, not slowly over a period of time. When you start quitting, get help and support to keep you smoke-free. This information is not intended to replace advice given to you by your health care provider. Make sure you discuss any questions you have with your  health care provider. Document Revised: 08/04/2021 Document Reviewed: 08/04/2021 Elsevier Patient Education  2024 Elsevier Inc.  You have been referred to [facility] for a complete eye exam. If you haven't heard from them in a few days, please call them to schedule your appointment.  [facility] [address] [place] [address] Phone: [phone]  Dr. Lajoyce Corners (foot specialist) Aldean Baker [address] [place] PHONE: [phone]   Next appointment: VIRTUAL/TELEPHONE APPOINTMENT Follow up in one year for your annual wellness visit April 03, 2024 at 10 am telephone visit.    Preventive Care 40 Years and Older, Female Preventive care refers to lifestyle choices and visits with your health care provider that can promote health and wellness. What does preventive care include? A yearly physical exam. This is also called an annual well check. Dental exams once or twice a year. Routine  eye exams. Ask your health care provider how often you should have your eyes checked. Personal lifestyle choices, including: Daily care of your teeth and gums. Regular physical activity. Eating a healthy diet. Avoiding tobacco and drug use. Limiting alcohol use. Practicing safe sex. Taking low-dose aspirin every day. Taking vitamin and mineral supplements as recommended by your health care provider. What happens during an annual well check? The services and screenings done by your health care provider during your annual well check will depend on your age, overall health, lifestyle risk factors, and family history of disease. Counseling  Your health care provider may ask you questions about your: Alcohol use. Tobacco use. Drug use. Emotional well-being. Home and relationship well-being. Sexual activity. Eating habits. History of falls. Memory and ability to understand (cognition). Work and work Astronomer. Reproductive health. Screening  You may have the  following tests or measurements: Height, weight, and BMI. Blood pressure. Lipid and cholesterol levels. These may be checked every 5 years, or more frequently if you are over 67 years old. Skin check. Lung cancer screening. You may have this screening every year starting at age 97 if you have a 30-pack-year history of smoking and currently smoke or have quit within the past 15 years. Fecal occult blood test (FOBT) of the stool. You may have this test every year starting at age 13. Flexible sigmoidoscopy or colonoscopy. You may have a sigmoidoscopy every 5 years or a colonoscopy every 10 years starting at age 36. Hepatitis C blood test. Hepatitis B blood test. Sexually transmitted disease (STD) testing. Diabetes screening. This is done by checking your blood sugar (glucose) after you have not eaten for a while (fasting). You may have this done every 1-3 years. Bone density scan. This is done to screen for osteoporosis. You may have this done starting at age 73. Mammogram. This may be done every 1-2 years. Talk to your health care provider about how often you should have regular mammograms. Talk with your health care provider about your test results, treatment options, and if necessary, the need for more tests. Vaccines  Your health care provider may recommend certain vaccines, such as: Influenza vaccine. This is recommended every year. Tetanus, diphtheria, and acellular pertussis (Tdap, Td) vaccine. You may need a Td booster every 10 years. Zoster vaccine. You may need this after age 40. Pneumococcal 13-valent conjugate (PCV13) vaccine. One dose is recommended after age 59. Pneumococcal polysaccharide (PPSV23) vaccine. One dose is recommended after age 87. Talk to your health care provider about which screenings and vaccines you need and how often you need them. This information is not intended to replace advice given to you by your health care provider. Make sure you discuss any questions you  have with your health care provider. Document Released: 09/09/2015 Document Revised: 05/02/2016 Document Reviewed: 06/14/2015 Elsevier Interactive Patient Education  2017 [employer].  Fall Prevention in the Home Falls can cause injuries. They can happen to people of all ages. There are many things you can do to make your home safe and to help prevent falls. What can I do on the outside of my home? Regularly fix the edges of walkways and driveways and fix any cracks. Remove anything that might make you trip as you walk through a door, such as a raised step or threshold. Trim any bushes or trees on the path to your home. Use bright outdoor lighting. Clear any walking paths of anything that might make someone trip, such as rocks or tools. Regularly  check to see if handrails are loose or broken. Make sure that both sides of any steps have handrails. Any raised decks and porches should have guardrails on the edges. Have any leaves, snow, or ice cleared regularly. Use sand or salt on walking paths during winter. Clean up any spills in your garage right away. This includes oil or grease spills. What can I do in the bathroom? Use night lights. Install grab bars by the toilet and in the tub and shower. Do not use towel bars as grab bars. Use non-skid mats or decals in the tub or shower. If you need to sit down in the shower, use a plastic, non-slip stool. Keep the floor dry. Clean up any water that spills on the floor as soon as it happens. Remove soap buildup in the tub or shower regularly. Attach bath mats securely with double-sided non-slip rug tape. Do not have throw rugs and other things on the floor that can make you trip. What can I do in the bedroom? Use night lights. Make sure that you have a light by your bed that is easy to reach. Do not use any sheets or blankets that are too big for your bed. They should not hang down onto the floor. Have a firm chair that has side arms. You can  use this for support while you get dressed. Do not have throw rugs and other things on the floor that can make you trip. What can I do in the kitchen? Clean up any spills right away. Avoid walking on wet floors. Keep items that you use a lot in easy-to-reach places. If you need to reach something above you, use a strong step stool that has a grab bar. Keep electrical cords out of the way. Do not use floor polish or wax that makes floors slippery. If you must use wax, use non-skid floor wax. Do not have throw rugs and other things on the floor that can make you trip. What can I do with my stairs? Do not leave any items on the stairs. Make sure that there are handrails on both sides of the stairs and use them. Fix handrails that are broken or loose. Make sure that handrails are as long as the stairways. Check any carpeting to make sure that it is firmly attached to the stairs. Fix any carpet that is loose or worn. Avoid having throw rugs at the top or bottom of the stairs. If you do have throw rugs, attach them to the floor with carpet tape. Make sure that you have a light switch at the top of the stairs and the bottom of the stairs. If you do not have them, ask someone to add them for you. What else can I do to help prevent falls? Wear shoes that: Do not have high heels. Have rubber bottoms. Are comfortable and fit you well. Are closed at the toe. Do not wear sandals. If you use a stepladder: Make sure that it is fully opened. Do not climb a closed stepladder. Make sure that both sides of the stepladder are locked into place. Ask someone to hold it for you, if possible. Clearly mark and make sure that you can see: Any grab bars or handrails. First and last steps. Where the edge of each step is. Use tools that help you move around (mobility aids) if they are needed. These include: Canes. Walkers. Scooters. Crutches. Turn on the lights when you go into a dark area. Replace any light  bulbs as soon as they burn out. Set up your furniture so you have a clear path. Avoid moving your furniture around. If any of your floors are uneven, fix them. If there are any pets around you, be aware of where they are. Review your medicines with your doctor. Some medicines can make you feel dizzy. This can increase your chance of falling. Ask your doctor what other things that you can do to help prevent falls. This information is not intended to replace advice given to you by your health care provider. Make sure you discuss any questions you have with your health care provider. Document Released: 06/09/2009 Document Revised: 01/19/2016 Document Reviewed: 09/17/2014 Elsevier Interactive Patient Education  2017 [employer].

## 2023-04-04 NOTE — Progress Notes (Signed)
 Because this visit was a virtual/telehealth visit,  certain criteria was not obtained, such a blood pressure, CBG if patient is a diabetic, and timed up and go. Any medications not marked as "taking" was not mentioned during the medication reconciliation part of the visit. Any vitals not documented were not able to be obtained due to this being a telehealth visit. Vitals documented are verbally provided by the patient.   Subjective:   Tabitha Miller is a 77 y.o. female who presents for an Initial Medicare Annual Wellness Visit.  Visit Complete: Virtual  I connected with  Tabitha Miller on 04/04/23 by a audio enabled telemedicine application and verified that I am speaking with the correct person using two identifiers.  Patient Location: Home  Provider Location: Home Office  I discussed the limitations of evaluation and management by telemedicine. The patient expressed understanding and agreed to proceed.  Patient Medicare AWV questionnaire was completed by the patient on n/a; I have confirmed that all information answered by patient is correct and no changes since this date.  Review of Systems     Cardiac Risk Factors include: advanced age (>22men, >12 women);hypertension;sedentary lifestyle;smoking/ tobacco exposure     Objective:    Today's Vitals   04/04/23 1500  BP: 120/60  Weight: 124 lb (56.2 kg)  Height: 5\' 5"  (1.651 m)   Body mass index is 20.63 kg/m.     04/04/2023    3:00 PM 08/21/2018   12:14 PM 10/08/2017    4:00 PM 10/08/2017    7:00 AM 10/08/2017    6:33 AM 09/05/2017    1:56 PM 03/08/2016    6:40 AM  Advanced Directives  Does Patient Have a Medical Advance Directive? No Yes No No No No No  Type of Advance Directive  Healthcare Power of Attorney       Does patient want to make changes to medical advance directive?  No - Patient declined       Would patient like information on creating a medical advance directive? No - Patient declined  No - Patient declined  No -  Patient declined Yes (MAU/Ambulatory/Procedural Areas - Information given) No - patient declined information    Current Medications (verified) Outpatient Encounter Medications as of 04/04/2023  Medication Sig   atenolol (TENORMIN) 25 MG tablet Take 25 mg by mouth at bedtime.    buPROPion (WELLBUTRIN XL) 150 MG 24 hr tablet Take 1 tablet by mouth daily.   triamterene-hydrochlorothiazide (DYAZIDE) 37.5-25 MG capsule Take 1 capsule by mouth daily.   valsartan (DIOVAN) 80 MG tablet TAKE 1 TABLET BY MOUTH DAILY FOR BLOOD PRESSURE   ascorbic acid (VITAMIN C) 500 MG tablet Take by mouth.   aspirin 81 MG tablet Take 81 mg by mouth daily.   atorvastatin (LIPITOR) 10 MG tablet Take 10 mg by mouth at bedtime.  (Patient not taking: Reported on 04/04/2023)   Bioflavonoid Products (GRAPE SEED PO) Take 1 capsule by mouth at bedtime.    Calcium-Magnesium-Vitamin D (CALCIUM MAGNESIUM PO) Take by mouth.   celecoxib (CELEBREX) 200 MG capsule Take 1 capsule (200 mg total) by mouth daily.   Chelated Zinc 50 MG TABS Take 1 tablet by mouth at bedtime.   chlorhexidine (PERIDEX) 0.12 % solution Use as directed 15 mLs in the mouth or throat as needed.   Cholecalciferol (VITAMIN D3) 10 MCG (400 UNIT) tablet Take by mouth.   clobetasol cream (TEMOVATE) 0.05 % Apply 1 application  topically as needed.   Coenzyme Q10 (  CO Q 10) 100 MG CAPS Take 1 capsule by mouth daily.   FENUGREEK PO Take 2 tablets by mouth daily. Fenugreek and thyme 350 mg/150mg    GARLIC PO Take by mouth.   Ginkgo Biloba 120 MG CAPS Take 1 capsule by mouth daily.   Green Tea, Camillia sinensis, (GREEN TEA EXTRACT PO) Take 1 tablet by mouth at bedtime.    Multiple Vitamins-Minerals (WOMENS MULTIVITAMIN PO) Take 1 tablet by mouth at bedtime.   predniSONE (DELTASONE) 20 MG tablet Take two tablets my mouth once daily in the morning for four days, then one tablet once daily in the morning for four days. (Patient not taking: Reported on 04/04/2023)   vitamin E  400 UNIT capsule Take 400 Units by mouth at bedtime.    No facility-administered encounter medications on file as of 04/04/2023.    Allergies (verified) Egg white (egg protein), Egg-derived products, and Other   History: Past Medical History:  Diagnosis Date   Chickenpox    COPD (chronic obstructive pulmonary disease) (HCC)    Heart palpitations    Hyperlipidemia    Osteoarthritis (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)    fingers, shoulders, knees   Vertigo approx 01/16   x1   Past Surgical History:  Procedure Laterality Date   APPENDECTOMY     BREAST EXCISIONAL BIOPSY Right 1995   neg   BUNIONECTOMY Bilateral    COLONOSCOPY     COLONOSCOPY WITH PROPOFOL N/A 09/25/2022   Procedure: COLONOSCOPY WITH PROPOFOL;  Surgeon: Regis Bill, MD;  Location: [facility];  Service: Endoscopy;  Laterality: N/A;   EXCISION PARTIAL PHALANX Right 03/08/2016   Procedure: EXCISION PARTIAL PHALANX 5TH RT TOE;  Surgeon: Recardo Evangelist, DPM;  Location: [facility];  Service: Podiatry;  Laterality: Right;  IVA WITH LOCAL   JOINT REPLACEMENT     STERIOD INJECTION Right 03/08/2016   Procedure: STEROID INJECTION;  Surgeon: Recardo Evangelist, DPM;  Location: [facility];  Service: Podiatry;  Laterality: Right;   TOE SURGERY Left    fractured 2nd toe   TOTAL HIP ARTHROPLASTY Left 06/13/14   TOTAL KNEE ARTHROPLASTY Bilateral 10/08/2017   Procedure: TOTAL KNEE BILATERAL;  Surgeon: Kennedy Bucker, MD;  Location: [facility];  Service: Orthopedics;  Laterality: Bilateral;   Family History  Problem Relation Age of Onset   Anxiety disorder Mother    Breast cancer Mother 103   Congestive Heart Failure Mother    Melanoma Father    Heart disease Father    Breast cancer Sister 85   Breast cancer Maternal Aunt 32   Social History   Socioeconomic History   Marital status: Married    Spouse name: Not on file   Number of children: Not on file   Years of education: Not on file    Highest education level: Not on file  Occupational History   Not on file  Tobacco Use   Smoking status: Every Day    Current packs/day: 0.50    Average packs/day: 0.5 packs/day for 30.0 years (15.0 ttl pk-yrs)    Types: Cigarettes   Smokeless tobacco: Never  Vaping Use   Vaping status: Never Used  Substance and Sexual Activity   Alcohol use: Yes    Comment: 2 drinks a night   Drug use: No   Sexual activity: Not on file  Other Topics Concern   Not on file  Social History Narrative   Not on file   Social Determinants of Health  Financial Resource Strain: Low Risk  (04/04/2023)   Overall Financial Resource Strain (CARDIA)    Difficulty of Paying Living Expenses: Not hard at all  Food Insecurity: No Food Insecurity (04/04/2023)   Hunger Vital Sign    Worried About Running Out of Food in the Last Year: Never true    Ran Out of Food in the Last Year: Never true  Transportation Needs: No Transportation Needs (04/04/2023)   PRAPARE - Administrator, Civil Service (Medical): No    Lack of Transportation (Non-Medical): No  Physical Activity: Insufficiently Active (04/04/2023)   Exercise Vital Sign    Days of Exercise per Week: 7 days    Minutes of Exercise per Session: 20 min  Stress: Stress Concern Present (04/04/2023)   [employer] of Occupational Health - Occupational Stress Questionnaire    Feeling of Stress : To some extent  Social Connections: Socially Isolated (04/04/2023)   Social Connection and Isolation Panel [NHANES]    Frequency of Communication with Friends and Family: More than three times a week    Frequency of Social Gatherings with Friends and Family: More than three times a week    Attends Religious Services: Never    Database administrator or Organizations: No    Attends Banker Meetings: Never    Marital Status: Widowed    Tobacco Counseling Ready to quit: Yes Counseling given: Yes   Clinical Intake:  Pre-visit preparation  completed: Yes  Pain : No/denies pain     BMI - recorded: 20.63 Nutritional Status: BMI of 19-24  Normal Nutritional Risks: None Diabetes: No  How often do you need to have someone help you when you read instructions, pamphlets, or other written materials from your doctor or pharmacy?: 1 - Never  Interpreter Needed?: No  Information entered by ::  Sheffield Hawker, CMA   Activities of Daily Living    04/04/2023    3:23 PM  In your present state of health, do you have any difficulty performing the following activities:  Hearing? 0  Vision? 0  Difficulty concentrating or making decisions? 0  Walking or climbing stairs? 0  Dressing or bathing? 0  Doing errands, shopping? 0  Preparing Food and eating ? N  Using the Toilet? N  In the past six months, have you accidently leaked urine? N  Do you have problems with loss of bowel control? N  Managing your Medications? N  Managing your Finances? N  Housekeeping or managing your Housekeeping? N    Patient Care Team: Doreene Nest, NP as PCP - General (Internal Medicine)  Indicate any recent Medical Services you may have received from other than [facility] providers in the past year (date may be approximate).     Assessment:   This is a routine wellness examination for [place].  Hearing/Vision screen Hearing Screening - Comments:: Patient denies any hearing difficulties.    Dietary issues and exercise activities discussed:     Goals Addressed             This Visit's Progress    Patient Stated       Work on increasing energy level and decreasing stress       Depression Screen    04/04/2023    3:11 PM 12/25/2022   12:09 PM  PHQ 2/9 Scores  PHQ - 2 Score 2 2  PHQ- 9 Score 4 4    Fall Risk    04/04/2023    3:23 PM 01/08/2023  11:34 AM 12/25/2022   11:37 AM  Fall Risk   Falls in the past year? 0 1 1  Number falls in past yr: 0 0 0  Injury with Fall? 0 1 1  Risk for fall due to : No Fall Risks History of fall(s)  History of fall(s)  Follow up Falls prevention discussed Falls evaluation completed Falls evaluation completed    MEDICARE RISK AT HOME:  Medicare Risk at Home - 04/04/23 1519     Any stairs in or around the home? Yes    If so, are there any without handrails? No    Home free of loose throw rugs in walkways, pet beds, electrical cords, etc? Yes    Adequate lighting in your home to reduce risk of falls? Yes    Life alert? No    Use of a cane, walker or w/c? No    Grab bars in the bathroom? No    Shower chair or bench in shower? Yes    Elevated toilet seat or a handicapped toilet? No             TIMED UP AND GO:  Was the test performed? No    Cognitive Function:        04/04/2023    3:09 PM  6CIT Screen  What Year? 0 points  What month? 0 points  What time? 0 points  Count back from 20 0 points  Months in reverse 0 points  Repeat phrase 0 points  Total Score 0 points    Immunizations Immunization History  Administered Date(s) Administered   Zoster Recombinant(Shingrix) 04/24/2017, 08/28/2017    TDAP status: Due, Education has been provided regarding the importance of this vaccine. Advised may receive this vaccine at local pharmacy or Health Dept. Aware to provide a copy of the vaccination record if obtained from local pharmacy or Health Dept. Verbalized acceptance and understanding.  Flu Vaccine status: Due, Education has been provided regarding the importance of this vaccine. Advised may receive this vaccine at local pharmacy or Health Dept. Aware to provide a copy of the vaccination record if obtained from local pharmacy or Health Dept. Verbalized acceptance and understanding.  Pneumococcal vaccine status: Declined,  Education has been provided regarding the importance of this vaccine but patient still declined. Advised may receive this vaccine at local pharmacy or Health Dept. Aware to provide a copy of the vaccination record if obtained from local pharmacy or  Health Dept. Verbalized acceptance and understanding.   Covid-19 vaccine status: Declined, Education has been provided regarding the importance of this vaccine but patient still declined. Advised may receive this vaccine at local pharmacy or Health Dept.or vaccine clinic. Aware to provide a copy of the vaccination record if obtained from local pharmacy or Health Dept. Verbalized acceptance and understanding.  Qualifies for Shingles Vaccine? Yes   Zostavax completed No   Shingrix Completed?: Yes  Screening Tests Health Maintenance  Topic Date Due   Medicare Annual Wellness (AWV)  Never done   COVID-19 Vaccine (1) Never done   Pneumonia Vaccine 41+ Years old (1 of 2 - PCV) Never done   Hepatitis C Screening  Never done   DTaP/Tdap/Td (1 - Tdap) Never done   DEXA SCAN  Never done   INFLUENZA VACCINE  03/28/2023   Colonoscopy  09/25/2032   Zoster Vaccines- Shingrix  Completed   HPV VACCINES  Aged Out    Health Maintenance  Health Maintenance Due  Topic Date Due   Medicare Annual  Wellness (AWV)  Never done   COVID-19 Vaccine (1) Never done   Pneumonia Vaccine 12+ Years old (1 of 2 - PCV) Never done   Hepatitis C Screening  Never done   DTaP/Tdap/Td (1 - Tdap) Never done   DEXA SCAN  Never done   INFLUENZA VACCINE  03/28/2023    Colorectal cancer screening: Type of screening: Colonoscopy. Completed 09/25/2022. Repeat every 10 years  Mammogram status: Completed 04/23/2022. Repeat every year  Bone Density status: Completed 11/14/2021. Results reflect: Bone density results: OSTEOPENIA. Repeat every 2 years.  Lung Cancer Screening: (Low Dose CT Chest recommended if Age 4-80 years, 20 pack-year currently smoking OR have quit w/in 15years.) does not qualify.   Additional Screening:  Hepatitis C Screening: does qualify; ordered 04/04/2023  Vision Screening: Recommended annual ophthalmology exams for early detection of glaucoma and other disorders of the eye. Is the patient up to date  with their annual eye exam?  No  Who is the provider or what is the name of the office in which the patient attends annual eye exams? Referral placed 04/04/2023 If pt is not established with a provider, would they like to be referred to a provider to establish care? Yes .   Dental Screening: Recommended annual dental exams for proper oral hygiene  Diabetic Foot Exam: n/a  Community Resource Referral / Chronic Care Management: CRR required this visit?  No   CCM required this visit?  No     Plan:     I have personally reviewed and noted the following in the patient's chart:   Medical and social history Use of alcohol, tobacco or illicit drugs  Current medications and supplements including opioid prescriptions. Patient is not currently taking opioid prescriptions. Functional ability and status Nutritional status Physical activity Advanced directives List of other physicians Hospitalizations, surgeries, and ER visits in previous 12 months Vitals Screenings to include cognitive, depression, and falls Referrals and appointments  In addition, I have reviewed and discussed with patient certain preventive protocols, quality metrics, and best practice recommendations. A written personalized care plan for preventive services as well as general preventive health recommendations were provided to patient.     Jordan Hawks Makana Rostad, CMA   04/04/2023   After Visit Summary: (Mail) Due to this being a telephonic visit, the after visit summary with patients personalized plan was offered to patient via mail   Nurse Notes: Hep C and eye referral placed.

## 2023-04-10 ENCOUNTER — Telehealth: Payer: Self-pay | Admitting: Primary Care

## 2023-04-10 DIAGNOSIS — F32A Depression, unspecified: Secondary | ICD-10-CM

## 2023-04-10 NOTE — Telephone Encounter (Signed)
Prescription Request  04/10/2023  LOV: 01/08/2023  What is the name of the medication or equipment? buPROPion (WELLBUTRIN XL) 150 MG 24 hr tablet  triamterene-hydrochlorothiazide (DYAZIDE) 37.5-25 MG capsule  atenolol (TENORMIN) 25 MG tablet   Have you contacted your pharmacy to request a refill? Yes   Which pharmacy would you like this sent to?  [facility] - [place], [place] - [address] [place] [address] Phone: [phone] Fax: [phone]    Patient notified that their request is being sent to the clinical staff for review and that they should receive a response within 2 business days.   Please advise at Mobile [phone] (mobile)

## 2023-04-11 MED ORDER — BUPROPION HCL ER (XL) 150 MG PO TB24: 150.0000 mg | ORAL_TABLET | Freq: Every day | ORAL | 1 refills | Status: DC

## 2023-04-11 NOTE — Telephone Encounter (Signed)
Please call patient:  I would like for her to try discontinuing her atenolol medication as there is no clear reason for her to be taking, especially since we started valsartan for BP control.  Please also remind her that we discontinued her triamterene-hydrochlorothiazide and switched her to valsartan for BP control.  Has she been taking Triamterene-hydrochlorothiazide since her last visit?  I will refill Bupropion now.

## 2023-04-11 NOTE — Telephone Encounter (Signed)
Please notify patient that the triamterene-hydrochlorothiazide medication is used for blood pressure, not allergies.  Is she talking about another medication?  During our visits in April and May we discontinued the triamterene-hydrochlorothiazide medication because it can be taxing on the kidneys, and we replaced it with a medication called valsartan which is used for blood pressure and kidney protection.  During her visit in May her blood pressure was well-controlled.  I was under the impression that she was only taking valsartan and atenolol for blood pressure.  Was she taking the triamterene-hydrochlorothiazide medication along with those medications?  Please also respond by clicking the "create note" button so that the response shows up in the encounter.

## 2023-04-12 NOTE — Telephone Encounter (Signed)
Spoke with patient phone regarding her medications.  She has been taking triamterene-hydrochlorothiazide, valsartan, and atenolol.  She did not stop triamterene hydrochlorothiazide as recommended given her longstanding mild CKD.  She does not wish to come off of triamterene-hydrochlorothiazide as it was prescribed by an allergist 45 years ago for swelling to her eyes.  We discussed that she does not need all of his blood pressure medication.  She is not monitoring her blood pressure at home.  We did discuss that triamterene-had a thiazide is taxing on the kidneys and that kidney function could improve if we discontinued.  She still would like to proceed with triamterene -hydrochlorothiazide  Stop valsartan.  Resume triamterene-hydrochlorothiazide as her CKD has been stable over the years.    Discontinue atenolol as there is no clear need.   She will monitor blood pressure and call us with readings in a week or 2.

## 2023-04-12 NOTE — Addendum Note (Signed)
Addended by: Doreene Nest on: 04/12/2023 05:30 PM   Modules accepted: Orders

## 2023-04-12 NOTE — Telephone Encounter (Signed)
Spoke with pt to clarify if at the time of her visit w/ Jae Dire in April and May was she taking triamterene-hydrochlorothiazide along with her other B/P medications, patient stated yes she was and she has been for years. She takes 1 daily at night. She has stopped the Atenolol and is now taking the valsartan. Pt wants to know also If it is harsh on her kidneys is she able to just skip days on taking triamterene-hydrochlorothiazide.

## 2023-04-15 DIAGNOSIS — H903 Sensorineural hearing loss, bilateral: Secondary | ICD-10-CM | POA: Diagnosis not present

## 2023-04-15 DIAGNOSIS — J301 Allergic rhinitis due to pollen: Secondary | ICD-10-CM | POA: Diagnosis not present

## 2023-05-08 ENCOUNTER — Ambulatory Visit
Admission: RE | Admit: 2023-05-08 | Discharge: 2023-05-08 | Disposition: A | Discharge status: Home / Self Care | Payer: PPO | Source: Ambulatory Visit | Attending: Primary Care | Admitting: Primary Care

## 2023-05-08 DIAGNOSIS — Z1231 Encounter for screening mammogram for malignant neoplasm of breast: Secondary | ICD-10-CM | POA: Diagnosis not present

## 2023-05-10 ENCOUNTER — Telehealth: Payer: Self-pay | Admitting: Primary Care

## 2023-05-10 NOTE — Telephone Encounter (Signed)
Noted.  Please have her call us in 2 weeks with blood pressure readings.  I will record that she has resumed the valsartan 80 mg.

## 2023-05-10 NOTE — Telephone Encounter (Signed)
Call from patient transferred to me. Patient requested that I call in new script for her to restart Valsartan. Advised patient that I can not send in script with out provider approval. Patient declined appointment to be seen today. Wanted message sent to Jae Dire to start back on. She also states she will reach out to pharmacy to see if they will refill her old script. Advised patient that Jae Dire had recommended she stop medications so I can't advise her on that.   Patient denies any chest pain, sob, or headache. She has had increased fatigue and that is what made her check her bp today. Her reading was 156/89 with HR 66. Patient was upset so I didn't ask her to re check at time of call.   I advised patient that Jae Dire is not in the office today but will send message she may be checking her in box.    Would like sent in to [facility]

## 2023-05-10 NOTE — Telephone Encounter (Signed)
Please call patient:  Do we have any more BP readings aside from the 156/89? What has her BP been running over the last 1 month?

## 2023-05-10 NOTE — Telephone Encounter (Signed)
Patient called in and stated that she was on a Valsartan for her blood pressure and her blood pressure has been a bit elevated. She stated that her blood pressure today was 153/89 pulse 66 and she has been exhausted. Offered patient an appointment to be seen today but she didn't want to come in. Sent to [place] as she took call.

## 2023-05-10 NOTE — Addendum Note (Signed)
Addended by: Doreene Nest on: 05/10/2023 05:13 PM   Modules accepted: Orders

## 2023-05-10 NOTE — Telephone Encounter (Signed)
Patient was not able to give me any additional readings. She has not been taking after she was told to come off bp medications. She did got to pharmacy and got refill for 90 days and she has taken one.

## 2023-05-13 NOTE — Telephone Encounter (Signed)
Called patient and reviewed all information. Patient verbalized understanding. Will call if any further questions.

## 2023-05-29 DIAGNOSIS — I10 Essential (primary) hypertension: Secondary | ICD-10-CM | POA: Diagnosis not present

## 2023-05-29 DIAGNOSIS — F411 Generalized anxiety disorder: Secondary | ICD-10-CM | POA: Diagnosis not present

## 2023-05-29 DIAGNOSIS — Z13820 Encounter for screening for osteoporosis: Secondary | ICD-10-CM | POA: Diagnosis not present

## 2023-05-29 DIAGNOSIS — J431 Panlobular emphysema: Secondary | ICD-10-CM | POA: Diagnosis not present

## 2023-05-29 DIAGNOSIS — E7800 Pure hypercholesterolemia, unspecified: Secondary | ICD-10-CM | POA: Diagnosis not present

## 2023-07-03 DIAGNOSIS — H01003 Unspecified blepharitis right eye, unspecified eyelid: Secondary | ICD-10-CM | POA: Diagnosis not present

## 2023-07-03 DIAGNOSIS — H04123 Dry eye syndrome of bilateral lacrimal glands: Secondary | ICD-10-CM | POA: Diagnosis not present

## 2023-07-03 DIAGNOSIS — H01006 Unspecified blepharitis left eye, unspecified eyelid: Secondary | ICD-10-CM | POA: Diagnosis not present

## 2023-07-10 ENCOUNTER — Encounter: Payer: Self-pay | Admitting: Psychiatry

## 2023-08-19 DIAGNOSIS — Z0000 Encounter for general adult medical examination without abnormal findings: Secondary | ICD-10-CM | POA: Diagnosis not present

## 2023-08-19 DIAGNOSIS — Z1211 Encounter for screening for malignant neoplasm of colon: Secondary | ICD-10-CM | POA: Diagnosis not present

## 2023-08-19 DIAGNOSIS — F5101 Primary insomnia: Secondary | ICD-10-CM | POA: Diagnosis not present

## 2023-08-19 DIAGNOSIS — F331 Major depressive disorder, recurrent, moderate: Secondary | ICD-10-CM | POA: Diagnosis not present

## 2023-09-08 ENCOUNTER — Other Ambulatory Visit: Payer: Self-pay | Admitting: Primary Care

## 2023-09-08 DIAGNOSIS — F32A Depression, unspecified: Secondary | ICD-10-CM

## 2023-10-10 ENCOUNTER — Other Ambulatory Visit: Payer: Self-pay | Admitting: Primary Care

## 2023-10-10 DIAGNOSIS — I10 Essential (primary) hypertension: Secondary | ICD-10-CM

## 2023-10-10 DIAGNOSIS — F32A Depression, unspecified: Secondary | ICD-10-CM

## 2023-10-10 DIAGNOSIS — F419 Anxiety disorder, unspecified: Secondary | ICD-10-CM

## 2023-10-11 NOTE — Telephone Encounter (Signed)
LVM for patient to c/b and schedule.

## 2023-10-11 NOTE — Telephone Encounter (Signed)
Patient is due for CPE/follow up in early May, this will be required prior to any further refills.  Please schedule, thank you!

## 2023-11-20 ENCOUNTER — Emergency Department

## 2023-11-20 ENCOUNTER — Encounter: Payer: Self-pay | Admitting: Intensive Care

## 2023-11-20 ENCOUNTER — Emergency Department
Admission: EM | Admit: 2023-11-20 | Discharge: 2023-11-20 | Disposition: A | Discharge status: Home / Self Care | Attending: Emergency Medicine | Admitting: Emergency Medicine

## 2023-11-20 ENCOUNTER — Other Ambulatory Visit: Payer: Self-pay

## 2023-11-20 DIAGNOSIS — J069 Acute upper respiratory infection, unspecified: Secondary | ICD-10-CM | POA: Diagnosis not present

## 2023-11-20 DIAGNOSIS — R059 Cough, unspecified: Secondary | ICD-10-CM | POA: Diagnosis present

## 2023-11-20 DIAGNOSIS — J449 Chronic obstructive pulmonary disease, unspecified: Secondary | ICD-10-CM | POA: Diagnosis not present

## 2023-11-20 DIAGNOSIS — D72829 Elevated white blood cell count, unspecified: Secondary | ICD-10-CM | POA: Insufficient documentation

## 2023-11-20 DIAGNOSIS — J9801 Acute bronchospasm: Secondary | ICD-10-CM | POA: Insufficient documentation

## 2023-11-20 DIAGNOSIS — Z87891 Personal history of nicotine dependence: Secondary | ICD-10-CM | POA: Insufficient documentation

## 2023-11-20 DIAGNOSIS — I10 Essential (primary) hypertension: Secondary | ICD-10-CM | POA: Insufficient documentation

## 2023-11-20 LAB — COMPREHENSIVE METABOLIC PANEL
ALT: 17 U/L (ref 0–44)
AST: 18 U/L (ref 15–41)
Albumin: 3.6 g/dL (ref 3.5–5.0)
Alkaline Phosphatase: 66 U/L (ref 38–126)
Anion gap: 12 (ref 5–15)
BUN: 19 mg/dL (ref 8–23)
CO2: 19 mmol/L — ABNORMAL LOW (ref 22–32)
Calcium: 8.9 mg/dL (ref 8.9–10.3)
Chloride: 103 mmol/L (ref 98–111)
Creatinine, Ser: 1.02 mg/dL — ABNORMAL HIGH (ref 0.44–1.00)
GFR, Estimated: 57 mL/min — ABNORMAL LOW (ref >60)
Glucose, Bld: 117 mg/dL — ABNORMAL HIGH (ref 70–99)
Potassium: 3.7 mmol/L (ref 3.5–5.1)
Sodium: 134 mmol/L — ABNORMAL LOW (ref 135–145)
Total Bilirubin: 1.1 mg/dL (ref 0.0–1.2)
Total Protein: 7.1 g/dL (ref 6.5–8.1)

## 2023-11-20 LAB — CBC WITH DIFFERENTIAL/PLATELET
Abs Immature Granulocytes: 0.06 10*3/uL (ref 0.00–0.07)
Basophils Absolute: 0 10*3/uL (ref 0.0–0.1)
Basophils Relative: 0 %
Eosinophils Absolute: 0 10*3/uL (ref 0.0–0.5)
Eosinophils Relative: 0 %
HCT: 38 % (ref 36.0–46.0)
Hemoglobin: 13.1 g/dL (ref 12.0–15.0)
Immature Granulocytes: 0 %
Lymphocytes Relative: 5 %
Lymphs Abs: 0.6 10*3/uL — ABNORMAL LOW (ref 0.7–4.0)
MCH: 34.5 pg — ABNORMAL HIGH (ref 26.0–34.0)
MCHC: 34.5 g/dL (ref 30.0–36.0)
MCV: 100 fL (ref 80.0–100.0)
Monocytes Absolute: 1.4 10*3/uL — ABNORMAL HIGH (ref 0.1–1.0)
Monocytes Relative: 10 %
Neutro Abs: 12.1 10*3/uL — ABNORMAL HIGH (ref 1.7–7.7)
Neutrophils Relative %: 85 %
Platelets: 206 10*3/uL (ref 150–400)
RBC: 3.8 MIL/uL — ABNORMAL LOW (ref 3.87–5.11)
RDW: 12.5 % (ref 11.5–15.5)
WBC: 14.2 10*3/uL — ABNORMAL HIGH (ref 4.0–10.5)
nRBC: 0 % (ref 0.0–0.2)

## 2023-11-20 LAB — TYPE AND SCREEN
ABO/RH(D): O POS
Antibody Screen: NEGATIVE

## 2023-11-20 LAB — RESP PANEL BY RT-PCR (RSV, FLU A&B, COVID)  RVPGX2
Influenza A by PCR: NEGATIVE
Influenza B by PCR: NEGATIVE
Resp Syncytial Virus by PCR: NEGATIVE
SARS Coronavirus 2 by RT PCR: NEGATIVE

## 2023-11-20 MED ORDER — PREDNISONE 50 MG PO TABS: 50.0000 mg | ORAL_TABLET | Freq: Every day | ORAL | 0 refills | Status: AC

## 2023-11-20 MED ORDER — IPRATROPIUM-ALBUTEROL 0.5-2.5 (3) MG/3ML IN SOLN
3.0000 mL | Freq: Once | RESPIRATORY_TRACT | Status: AC
  Administered 2023-11-20: 3 mL via RESPIRATORY_TRACT
  Filled 2023-11-20: qty 3

## 2023-11-20 MED ORDER — IPRATROPIUM-ALBUTEROL 0.5-2.5 (3) MG/3ML IN SOLN
3.0000 mL | Freq: Once | RESPIRATORY_TRACT | Status: AC
  Administered 2023-11-20: 3 mL via RESPIRATORY_TRACT

## 2023-11-20 MED ORDER — IPRATROPIUM-ALBUTEROL 0.5-2.5 (3) MG/3ML IN SOLN
RESPIRATORY_TRACT | Status: AC
  Filled 2023-11-20: qty 3

## 2023-11-20 MED ORDER — DOXYCYCLINE HYCLATE 100 MG PO TABS
100.0000 mg | ORAL_TABLET | Freq: Once | ORAL | Status: AC
  Administered 2023-11-20: 100 mg via ORAL
  Filled 2023-11-20: qty 1

## 2023-11-20 MED ORDER — DOXYCYCLINE HYCLATE 100 MG PO TABS: 100.0000 mg | ORAL_TABLET | Freq: Two times a day (BID) | ORAL | 0 refills | Status: AC

## 2023-11-20 MED ORDER — PREDNISONE 20 MG PO TABS
60.0000 mg | ORAL_TABLET | Freq: Once | ORAL | Status: AC
Start: 2023-11-20 — End: 2023-11-20
  Administered 2023-11-20: 60 mg via ORAL
  Filled 2023-11-20: qty 3

## 2023-11-20 MED ORDER — ACETAMINOPHEN 325 MG PO TABS: 650.0000 mg | ORAL_TABLET | Freq: Once | ORAL | Status: DC

## 2023-11-20 NOTE — ED Notes (Signed)
 Pt to xray

## 2023-11-20 NOTE — ED Notes (Signed)
 Pt d/c home per EDP order. Discharge summary reviewed, pt verbalizes understanding. NAD . Ambulatory off unit

## 2023-11-20 NOTE — ED Triage Notes (Signed)
 Patient reports congestion, fever, cough, and chills started Sunday. Last night she started having dark stools   Denies pain

## 2023-11-20 NOTE — ED Triage Notes (Signed)
 First Nurse Note: Patient to ED via [facility] from home for cough. States just not feeling well. Having nausea. Also having black colored diarrhea. Denies blood thinners.   123/70 102 HR 99% RA 118 cbg 99 oral

## 2023-11-20 NOTE — ED Provider Notes (Signed)
 [facility] Provider Note    Event Date/Time   First MD Initiated Contact with Patient 11/20/23 1651     (approximate)   History   Cough fever, mild shortness of breath   HPI  Tabitha Miller is a 78 y.o. female with a history of COPD, hypertension, cigarette use who presents with cough, fever, shortness of breath.  Which has been ongoing for several days.     Physical Exam   Triage Vital Signs: ED Triage Vitals  Encounter Vitals Group     BP 11/20/23 1550 (!) 132/59     Systolic BP Percentile --      Diastolic BP Percentile --      Pulse Rate 11/20/23 1550 100     Resp 11/20/23 1550 17     Temp 11/20/23 1550 (!) 100.6 F (38.1 C)     Temp Source 11/20/23 1550 Oral     SpO2 11/20/23 1550 94 %     Weight 11/20/23 1553 56.2 kg (124 lb)     Height 11/20/23 1553 1.651 m (5\' 5" )     Head Circumference --      Peak Flow --      Pain Score 11/20/23 1553 0     Pain Loc --      Pain Education --      Exclude from Growth Chart --     Most recent vital signs: Vitals:   11/20/23 1550 11/20/23 1808  BP: (!) 132/59   Pulse: 100   Resp: 17   Temp: (!) 100.6 F (38.1 C) 98.9 F (37.2 C)  SpO2: 94%      General: Awake, no distress.  CV:  Good peripheral perfusion.  Tachycardia Resp:  Normal effort.  Scattered mild wheezes Abd:  No distention.  Other:  No lower extremity edema   ED Results / Procedures / Treatments   Labs (all labs ordered are listed, but only abnormal results are displayed) Labs Reviewed  CBC WITH DIFFERENTIAL/PLATELET - Abnormal; Notable for the following components:      Result Value   WBC 14.2 (*)    RBC 3.80 (*)    MCH 34.5 (*)    Neutro Abs 12.1 (*)    Lymphs Abs 0.6 (*)    Monocytes Absolute 1.4 (*)    All other components within normal limits  COMPREHENSIVE METABOLIC PANEL - Abnormal; Notable for the following components:   Sodium 134 (*)    CO2 19 (*)    Glucose, Bld 117 (*)    Creatinine, Ser 1.02 (*)     GFR, Estimated 57 (*)    All other components within normal limits  RESP PANEL BY RT-PCR (RSV, FLU A&B, COVID)  RVPGX2  TYPE AND SCREEN     EKG    RADIOLOGY Chest x-ray viewed interpret by me, no pneumonia    PROCEDURES:  Critical Care performed:   Procedures   MEDICATIONS ORDERED IN ED: Medications  ipratropium-albuterol (DUONEB) 0.5-2.5 (3) MG/3ML nebulizer solution (  Not Given 11/20/23 1806)  ipratropium-albuterol (DUONEB) 0.5-2.5 (3) MG/3ML nebulizer solution 3 mL (3 mLs Nebulization Given 11/20/23 1734)  ipratropium-albuterol (DUONEB) 0.5-2.5 (3) MG/3ML nebulizer solution 3 mL (3 mLs Nebulization Given 11/20/23 1718)  doxycycline (VIBRA-TABS) tablet 100 mg (100 mg Oral Given 11/20/23 1845)  predniSONE (DELTASONE) tablet 60 mg (60 mg Oral Given 11/20/23 1845)     IMPRESSION / MDM / ASSESSMENT AND PLAN / ED COURSE  I reviewed the triage vital signs and  the nursing notes. Patient's presentation is most consistent with acute presentation with potential threat to life or bodily function.  Patient presents with reports of shortness of breath, fever, cough.  Differential includes pneumonia, upper respiratory infection, viral illness, doubt CHF, COPD exacerbation is a possibility  She reports she stopped smoking yesterday.  Oxygen saturations are acceptable on room air  Lab work notable for mild elevated white blood cell count, she is febrile.  COVID flu negative, chest x-ray without evidence of pneumonia  Treated with DuoNebs with improvement in shortness of breath has greatly improved.  Will treat with prednisone, antibiotics for possible early pneumonia, COPD exacerbation.  Appropriate for discharge at this time, no indication for admission.        FINAL CLINICAL IMPRESSION(S) / ED DIAGNOSES   Final diagnoses:  Upper respiratory tract infection, unspecified type  Bronchospasm     Rx / DC Orders   ED Discharge Orders          Ordered    predniSONE  (DELTASONE) 50 MG tablet  Daily with breakfast        11/20/23 1836    doxycycline (VIBRA-TABS) 100 MG tablet  2 times daily        11/20/23 1836             Note:  This document was prepared using Dragon voice recognition software and may include unintentional dictation errors.   Jene Every, MD 11/20/23 [phone]

## 2024-02-03 LAB — COLOGUARD: COLOGUARD: POSITIVE — AB

## 2024-03-26 ENCOUNTER — Other Ambulatory Visit: Payer: Self-pay

## 2024-03-26 ENCOUNTER — Ambulatory Visit: Admitting: Certified Registered"

## 2024-03-26 ENCOUNTER — Encounter: Payer: Self-pay | Admitting: Gastroenterology

## 2024-03-26 ENCOUNTER — Ambulatory Visit
Admission: RE | Admit: 2024-03-26 | Discharge: 2024-03-26 | Disposition: A | Discharge status: Home / Self Care | Attending: Gastroenterology | Admitting: Gastroenterology

## 2024-03-26 ENCOUNTER — Encounter
Admission: RE | Disposition: A | Discharge status: Home / Self Care | Payer: Self-pay | Source: Home / Self Care | Attending: Gastroenterology

## 2024-03-26 DIAGNOSIS — K644 Residual hemorrhoidal skin tags: Secondary | ICD-10-CM | POA: Insufficient documentation

## 2024-03-26 DIAGNOSIS — K648 Other hemorrhoids: Secondary | ICD-10-CM | POA: Insufficient documentation

## 2024-03-26 DIAGNOSIS — K5730 Diverticulosis of large intestine without perforation or abscess without bleeding: Secondary | ICD-10-CM | POA: Insufficient documentation

## 2024-03-26 DIAGNOSIS — R195 Other fecal abnormalities: Secondary | ICD-10-CM | POA: Diagnosis not present

## 2024-03-26 DIAGNOSIS — Q438 Other specified congenital malformations of intestine: Secondary | ICD-10-CM | POA: Insufficient documentation

## 2024-03-26 DIAGNOSIS — I10 Essential (primary) hypertension: Secondary | ICD-10-CM | POA: Insufficient documentation

## 2024-03-26 DIAGNOSIS — F32A Depression, unspecified: Secondary | ICD-10-CM | POA: Diagnosis not present

## 2024-03-26 DIAGNOSIS — Z09 Encounter for follow-up examination after completed treatment for conditions other than malignant neoplasm: Secondary | ICD-10-CM | POA: Diagnosis present

## 2024-03-26 DIAGNOSIS — J449 Chronic obstructive pulmonary disease, unspecified: Secondary | ICD-10-CM | POA: Diagnosis not present

## 2024-03-26 DIAGNOSIS — F17210 Nicotine dependence, cigarettes, uncomplicated: Secondary | ICD-10-CM | POA: Insufficient documentation

## 2024-03-26 HISTORY — PX: COLONOSCOPY: SHX5424

## 2024-03-26 SURGERY — COLONOSCOPY
Anesthesia: General

## 2024-03-26 MED ORDER — LIDOCAINE HCL (CARDIAC) PF 100 MG/5ML IV SOSY
PREFILLED_SYRINGE | INTRAVENOUS | Status: DC | PRN
  Administered 2024-03-26: 50 mg via INTRAVENOUS

## 2024-03-26 MED ORDER — SODIUM CHLORIDE 0.9 % IV SOLN: INTRAVENOUS | Status: DC

## 2024-03-26 MED ORDER — PROPOFOL 500 MG/50ML IV EMUL
INTRAVENOUS | Status: DC | PRN
  Administered 2024-03-26: 100 ug/kg/min via INTRAVENOUS

## 2024-03-26 MED ORDER — PROPOFOL 10 MG/ML IV BOLUS
INTRAVENOUS | Status: DC | PRN
Start: 2024-03-26 — End: 2024-03-26
  Administered 2024-03-26: 60 mg via INTRAVENOUS

## 2024-03-26 NOTE — Op Note (Signed)
 [facility] Gastroenterology Patient Name: Tabitha Miller Procedure Date: 03/26/2024 10:24 AM MRN: [id number] Account #: [id number] Date of Birth: 09/26/1945 Admit Type: Outpatient Age: 78 Room: [facility] ENDO ROOM 3 Gender: Female Note Status: Finalized Instrument Name: Peds Colonoscope [id number] Procedure:             Colonoscopy Indications:           Last colonoscopy: January 2024, Positive Cologuard test Providers:             Corinn Jess Brooklyn MD, MD Referring MD:          Reyes BIRCH. Auston, MD (Referring MD) Medicines:             General Anesthesia Complications:         No immediate complications. Estimated blood loss: None. Procedure:             Pre-Anesthesia Assessment:                        - Prior to the procedure, a History and Physical was                         performed, and patient medications and allergies were                         reviewed. The patient is competent. The risks and                         benefits of the procedure and the sedation options and                         risks were discussed with the patient. All questions                         were answered and informed consent was obtained.                         Patient identification and proposed procedure were                         verified by the physician, the nurse, the                         anesthesiologist, the anesthetist and the technician                         in the pre-procedure area in the procedure room in the                         endoscopy suite. Mental Status Examination: alert and                         oriented. Airway Examination: normal oropharyngeal                         airway and neck mobility. Respiratory Examination:                         clear to auscultation. CV Examination: normal.  Prophylactic Antibiotics: The patient does not require                         prophylactic antibiotics. Prior Anticoagulants: The                          patient has taken no anticoagulant or antiplatelet                         agents. ASA Grade Assessment: III - A patient with                         severe systemic disease. After reviewing the risks and                         benefits, the patient was deemed in satisfactory                         condition to undergo the procedure. The anesthesia                         plan was to use general anesthesia. Immediately prior                         to administration of medications, the patient was                         re-assessed for adequacy to receive sedatives. The                         heart rate, respiratory rate, oxygen saturations,                         blood pressure, adequacy of pulmonary ventilation, and                         response to care were monitored throughout the                         procedure. The physical status of the patient was                         re-assessed after the procedure.                        After obtaining informed consent, the colonoscope was                         passed under direct vision. Throughout the procedure,                         the patient's blood pressure, pulse, and oxygen                         saturations were monitored continuously. The                         Colonoscope was introduced through the anus and  advanced to the the cecum, identified by appendiceal                         orifice and ileocecal valve. The colonoscopy was                         unusually difficult due to a tortuous colon.                         Successful completion of the procedure was aided by                         applying abdominal pressure. The patient tolerated the                         procedure well. The quality of the bowel preparation                         was evaluated using the BBPS [facility] Bowel Preparation                         Scale) with scores of: Right Colon = 3,  Transverse                         Colon = 3 and Left Colon = 3 (entire mucosa seen well                         with no residual staining, small fragments of stool or                         opaque liquid). The total BBPS score equals 9. The                         ileocecal valve, appendiceal orifice, and rectum were                         photographed. Findings:      The perianal and digital rectal examinations were normal. Pertinent       negatives include normal sphincter tone and no palpable rectal lesions.      Multiple diverticula were found in the recto-sigmoid colon and sigmoid       colon.      Non-bleeding external and internal hemorrhoids were found during       retroflexion. The hemorrhoids were large.      The exam was otherwise without abnormality. Impression:            - Diverticulosis in the recto-sigmoid colon and in the                         sigmoid colon.                        - Non-bleeding external and internal hemorrhoids.                        - The examination was otherwise normal.                        -  No specimens collected. Recommendation:        - Discharge patient to home (with escort).                        - Resume previous diet today.                        - Continue present medications.                        - Repeat colonoscopy in 10 years for screening                         purposes. Procedure Code(s):     --- Professional ---                        [phone], Colonoscopy, flexible; diagnostic, including                         collection of specimen(s) by brushing or washing, when                         performed (separate procedure) Diagnosis Code(s):     --- Professional ---                        K64.8, Other hemorrhoids                        R19.5, Other fecal abnormalities                        K57.30, Diverticulosis of large intestine without                         perforation or abscess without bleeding CPT copyright 2022  American Medical Association. All rights reserved. The codes documented in this report are preliminary and upon coder review may  be revised to meet current compliance requirements. Dr. Corinn Brooklyn Corinn Jess Brooklyn MD, MD 03/26/2024 10:53:31 AM This report has been signed electronically. Number of Addenda: 0 Note Initiated On: 03/26/2024 10:24 AM Scope Withdrawal Time: 0 hours 7 minutes 0 seconds  Total Procedure Duration: 0 hours 16 minutes 6 seconds  Estimated Blood Loss:  Estimated blood loss: none.      [facility]

## 2024-03-26 NOTE — Anesthesia Postprocedure Evaluation (Signed)
 Anesthesia Post Note  Patient: Tabitha Miller  Procedure(s) Performed: COLONOSCOPY  Patient location during evaluation: PACU Anesthesia Type: General Level of consciousness: awake Pain management: pain level controlled Vital Signs Assessment: post-procedure vital signs reviewed and stable Respiratory status: nonlabored ventilation Cardiovascular status: stable Anesthetic complications: no   No notable events documented.   Last Vitals:  Vitals:   03/26/24 1103 03/26/24 1113  BP: (!) 106/95 115/67  Pulse: 62 (!) 53  Resp: 17 20  Temp:    SpO2: 100% 98%    Last Pain:  Vitals:   03/26/24 1053  TempSrc: Temporal  PainSc: 0-No pain                 VAN STAVEREN,Stillman Buenger

## 2024-03-26 NOTE — Transfer of Care (Signed)
 Immediate Anesthesia Transfer of Care Note  Patient: Tabitha Miller  Procedure(s) Performed: COLONOSCOPY  Patient Location: Endoscopy Unit  Anesthesia Type:General  Level of Consciousness: drowsy  Airway & Oxygen Therapy: Patient Spontanous Breathing  Post-op Assessment: Report given to RN  Post vital signs: stable  Last Vitals:  Vitals Value Taken Time  BP    Temp    Pulse 64 03/26/24 10:53  Resp 14 03/26/24 10:53  SpO2 99 % 03/26/24 10:53  Vitals shown include unfiled device data.  Last Pain:  Vitals:   03/26/24 0934  TempSrc: Temporal  PainSc: 0-No pain         Complications: No notable events documented.

## 2024-03-26 NOTE — H&P (Signed)
 Tabitha JONELLE Brooklyn, MD [facility], DHIP [address]  [place], [place] [address]  Main: [phone] Fax:  [phone] Pager: [phone]   Primary Care Physician:  Auston Reyes BIRCH, MD Primary Gastroenterologist:  Dr. Corinn JONELLE Miller  Pre-Procedure History & Physical: HPI:  Tabitha Miller is a 78 y.o. female is here for an colonoscopy.   Past Medical History:  Diagnosis Date   Chickenpox    COPD (chronic obstructive pulmonary disease) (HCC)    Heart palpitations    Hyperlipidemia    Hypertension    Osteoarthritis (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)    fingers, shoulders, knees   Vertigo approx 01/16   x1    Past Surgical History:  Procedure Laterality Date   APPENDECTOMY     BREAST EXCISIONAL BIOPSY Right 1995   neg   BUNIONECTOMY Bilateral    COLONOSCOPY     COLONOSCOPY WITH PROPOFOL  N/A 09/25/2022   Procedure: COLONOSCOPY WITH PROPOFOL ;  Surgeon: Maryruth Ole DASEN, MD;  Location: [facility] ENDOSCOPY;  Service: Endoscopy;  Laterality: N/A;   EXCISION PARTIAL PHALANX Right 03/08/2016   Procedure: EXCISION PARTIAL PHALANX 5TH RT TOE;  Surgeon: Donnice Cory, DPM;  Location: [facility];  Service: Podiatry;  Laterality: Right;  IVA WITH LOCAL   JOINT REPLACEMENT     STERIOD INJECTION Right 03/08/2016   Procedure: STEROID INJECTION;  Surgeon: Donnice Cory, DPM;  Location: [facility];  Service: Podiatry;  Laterality: Right;   TOE SURGERY Left    fractured 2nd toe   TOTAL HIP ARTHROPLASTY Left 06/13/14   TOTAL KNEE ARTHROPLASTY Bilateral 10/08/2017   Procedure: TOTAL KNEE BILATERAL;  Surgeon: Kathlynn Sharper, MD;  Location: [facility] ORS;  Service: Orthopedics;  Laterality: Bilateral;    Prior to Admission medications   Medication Sig Start Date End Date Taking? Authorizing Provider  ascorbic acid (VITAMIN C) 500 MG tablet Take by mouth.   Yes [provider]  Bioflavonoid Products (GRAPE SEED PO) Take 1 capsule by  mouth at bedtime.    Yes [provider]  Calcium -Magnesium -Vitamin D (CALCIUM  MAGNESIUM  PO) Take by mouth.   Yes [provider]  Chelated Zinc  50 MG TABS Take 1 tablet by mouth at bedtime.   Yes [provider]  Cholecalciferol (VITAMIN D3) 10 MCG (400 UNIT) tablet Take by mouth.   Yes [provider]  FENUGREEK PO Take 2 tablets by mouth daily. Fenugreek and thyme 350 mg/150mg    Yes [provider]  GARLIC PO Take by mouth.   Yes [provider]  valsartan  (DIOVAN ) 80 MG tablet Take 1 tablet (80 mg total) by mouth daily. for blood pressure 10/11/23  Yes Clark, Katherine K, NP  vitamin E  400 UNIT capsule Take 400 Units by mouth at bedtime.    Yes [provider]  aspirin  81 MG tablet Take 81 mg by mouth daily.    [provider]  atorvastatin  (LIPITOR) 10 MG tablet Take 10 mg by mouth at bedtime.  Patient not taking: Reported on 04/04/2023    [provider]  buPROPion  (WELLBUTRIN  XL) 150 MG 24 hr tablet TAKE ONE TABLET BY MOUTH EVERY DAY FOR DEPRESSION 10/11/23   Clark, Katherine K, NP  celecoxib  (CELEBREX ) 200 MG capsule Take 1 capsule (200 mg total) by mouth daily. 01/16/23   Copland, Jacques, MD  chlorhexidine  (PERIDEX ) 0.12 % solution Use as directed 15 mLs in the mouth or throat as needed.    [provider]  clobetasol  cream (TEMOVATE ) 0.05 % Apply 1 application  topically as needed.    [provider]  Coenzyme Q10 (CO Q 10) 100 MG CAPS Take 1 capsule by mouth daily.    [provider]  doxycycline  (VIBRA -TABS) 100 MG tablet Take 1 tablet (100 mg total) by mouth 2 (two) times daily. 11/20/23   Arlander Charleston, MD  Ginkgo Biloba 120 MG CAPS Take 1 capsule by mouth daily.    [provider]  Landy Nyhan, Camillia sinensis, (GREEN TEA EXTRACT PO) Take 1 tablet by mouth at bedtime.     [provider]  Multiple Vitamins-Minerals (WOMENS MULTIVITAMIN PO) Take 1 tablet by mouth  at bedtime.    [provider]  predniSONE  (DELTASONE ) 50 MG tablet Take 1 tablet (50 mg total) by mouth daily with breakfast. 11/20/23   Arlander Charleston, MD  triamterene -hydrochlorothiazide  (DYAZIDE ) 37.5-25 MG capsule Take 1 capsule by mouth daily.    [provider]    Allergies as of 03/10/2024 - Review Complete 11/20/2023  Allergen Reaction Noted   Egg white (egg protein) Swelling 04/01/2017   Egg-derived products  08/21/2018   Other Other (See Comments) 08/28/2017    Family History  Problem Relation Age of Onset   Anxiety disorder Mother    Breast cancer Mother 17   Congestive Heart Failure Mother    Melanoma Father    Heart disease Father    Breast cancer Sister 44   Breast cancer Maternal Aunt 28    Social History   Socioeconomic History   Marital status: Married    Spouse name: Not on file   Number of children: Not on file   Years of education: Not on file   Highest education level: Not on file  Occupational History   Not on file  Tobacco Use   Smoking status: Every Day    Current packs/day: 0.50    Average packs/day: 0.5 packs/day for 30.0 years (15.0 ttl pk-yrs)    Types: Cigarettes   Smokeless tobacco: Never  Vaping Use   Vaping status: Never Used  Substance and Sexual Activity   Alcohol use: Yes    Alcohol/week: 10.0 standard drinks of alcohol    Types: 10 Glasses of wine per week   Drug use: Yes    Types: Marijuana   Sexual activity: Not on file  Other Topics Concern   Not on file  Social History Narrative   Not on file   Social Drivers of Health   Financial Resource Strain: Low Risk  (03/10/2024)   Received from [facility]   Overall Financial Resource Strain (CARDIA)    Difficulty of Paying Living Expenses: Not hard at all  Food Insecurity: No Food Insecurity (03/10/2024)   Received from [facility]   Hunger Vital Sign    Within the past 12 months, you worried that your food would run out  before you got the money to buy more.: Never true    Within the past 12 months, the food you bought just didn't last and you didn't have money to get more.: Never true  Transportation Needs: No Transportation Needs (03/10/2024)   Received from [facility] - Transportation    In the past 12 months, has lack of transportation kept you from medical appointments or from getting medications?: No    Lack of Transportation (Non-Medical): No  Physical Activity: Insufficiently Active (04/04/2023)   Exercise Vital Sign    Days of  Exercise per Week: 7 days    Minutes of Exercise per Session: 20 min  Stress: Stress Concern Present (04/04/2023)   [employer] of Occupational Health - Occupational Stress Questionnaire    Feeling of Stress : To some extent  Social Connections: Socially Isolated (04/04/2023)   Social Connection and Isolation Panel    Frequency of Communication with Friends and Family: More than three times a week    Frequency of Social Gatherings with Friends and Family: More than three times a week    Attends Religious Services: Never    Database administrator or Organizations: No    Attends Banker Meetings: Never    Marital Status: Widowed  Intimate Partner Violence: Not At Risk (04/04/2023)   Humiliation, Afraid, Rape, and Kick questionnaire    Fear of Current or Ex-Partner: No    Emotionally Abused: No    Physically Abused: No    Sexually Abused: No    Review of Systems: See HPI, otherwise negative ROS  Physical Exam: BP 138/77   Pulse 62   Temp (!) 96.4 F (35.8 C) (Temporal)   Resp 16   Ht 5' 3 (1.6 m)   Wt 55 kg   SpO2 98%   BMI 21.47 kg/m  General:   Alert,  pleasant and cooperative in NAD Head:  Normocephalic and atraumatic. Neck:  Supple; no masses or thyromegaly. Lungs:  Clear throughout to auscultation.    Heart:  Regular rate and rhythm. Abdomen:  Soft, nontender and nondistended. Normal bowel sounds, without  guarding, and without rebound.   Neurologic:  Alert and  oriented x4;  grossly normal neurologically.  Impression/Plan: Tabitha Miller is here for an colonoscopy to be performed for positive cologuard  Risks, benefits, limitations, and alternatives regarding  colonoscopy have been reviewed with the patient.  Questions have been answered.  All parties agreeable.   Tabitha Brooklyn, MD  03/26/2024, 10:19 AM

## 2024-03-26 NOTE — Anesthesia Preprocedure Evaluation (Signed)
 Anesthesia Evaluation  Patient identified by MRN, date of birth, ID band Patient awake    Reviewed: Allergy & Precautions, NPO status , Patient's Chart, lab work & pertinent test results  Airway Mallampati: III  TM Distance: >3 FB Neck ROM: full    Dental  (+) Teeth Intact   Pulmonary neg pulmonary ROS, COPD, Current Smoker and Patient abstained from smoking.   Pulmonary exam normal breath sounds clear to auscultation       Cardiovascular Exercise Tolerance: Good hypertension, Pt. on medications negative cardio ROS Normal cardiovascular exam Rhythm:Regular Rate:Normal     Neuro/Psych    Depression    negative neurological ROS  negative psych ROS   GI/Hepatic negative GI ROS, Neg liver ROS,,,  Endo/Other  negative endocrine ROS    Renal/GU Renal InsufficiencyRenal diseasenegative Renal ROS  negative genitourinary   Musculoskeletal   Abdominal   Peds negative pediatric ROS (+)  Hematology negative hematology ROS (+)   Anesthesia Other Findings Past Medical History: No date: Chickenpox No date: COPD (chronic obstructive pulmonary disease) (HCC) No date: Heart palpitations No date: Hyperlipidemia No date: Hypertension No date: Osteoarthritis (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)     Comment:  fingers, shoulders, knees approx 01/16: Vertigo     Comment:  x1  Past Surgical History: No date: APPENDECTOMY 1995: BREAST EXCISIONAL BIOPSY; Right     Comment:  neg No date: BUNIONECTOMY; Bilateral No date: COLONOSCOPY 09/25/2022: COLONOSCOPY WITH PROPOFOL ; N/A     Comment:  Procedure: COLONOSCOPY WITH PROPOFOL ;  Surgeon:               Maryruth Ole DASEN, MD;  Location: [facility] ENDOSCOPY;                Service: Endoscopy;  Laterality: N/A; 03/08/2016: EXCISION PARTIAL PHALANX; Right     Comment:  Procedure: EXCISION PARTIAL PHALANX 5TH RT TOE;                Surgeon: Donnice Cory, DPM;  Location: [facility]ERY               [facility];  Service: Podiatry;  Laterality: Right;  IVA WITH               LOCAL No date: JOINT REPLACEMENT 03/08/2016: STERIOD INJECTION; Right     Comment:  Procedure: STEROID INJECTION;  Surgeon: Donnice Cory,              DPM;  Location: [facility];  Service: Podiatry;               Laterality: Right; No date: TOE SURGERY; Left     Comment:  fractured 2nd toe 06/13/14: TOTAL HIP ARTHROPLASTY; Left 10/08/2017: TOTAL KNEE ARTHROPLASTY; Bilateral     Comment:  Procedure: TOTAL KNEE BILATERAL;  Surgeon: Kathlynn Sharper, MD;  Location: [facility] ORS;  Service: Orthopedics;               Laterality: Bilateral;  BMI    Body Mass Index: 21.47 kg/m      Reproductive/Obstetrics negative OB ROS                              Anesthesia Physical Anesthesia Plan  ASA: 3  Anesthesia Plan: General   Post-op Pain Management:    Induction: Intravenous  PONV Risk Score and Plan: Propofol  infusion  and TIVA  Airway Management Planned: Natural Airway and Nasal Cannula  Additional Equipment:   Intra-op Plan:   Post-operative Plan:   Informed Consent: I have reviewed the patients History and Physical, chart, labs and discussed the procedure including the risks, benefits and alternatives for the proposed anesthesia with the patient or authorized representative who has indicated his/her understanding and acceptance.     Dental Advisory Given  Plan Discussed with: CRNA  Anesthesia Plan Comments:         Anesthesia Quick Evaluation

## 2024-05-08 ENCOUNTER — Other Ambulatory Visit: Payer: Self-pay | Admitting: Internal Medicine

## 2024-05-08 DIAGNOSIS — Z1231 Encounter for screening mammogram for malignant neoplasm of breast: Secondary | ICD-10-CM

## 2024-05-29 ENCOUNTER — Ambulatory Visit
Admission: RE | Admit: 2024-05-29 | Discharge: 2024-05-29 | Disposition: A | Discharge status: Home / Self Care | Source: Ambulatory Visit | Attending: Internal Medicine | Admitting: Internal Medicine

## 2024-05-29 DIAGNOSIS — Z1231 Encounter for screening mammogram for malignant neoplasm of breast: Secondary | ICD-10-CM | POA: Diagnosis present

## 2024-06-17 ENCOUNTER — Encounter

## 2024-10-01 ENCOUNTER — Telehealth: Payer: Self-pay

## 2024-10-01 NOTE — Telephone Encounter (Unsigned)
 Copied from CRM #[id number]. Topic: Appointments - Scheduling Inquiry for Clinic >> Oct 01, 2024  3:45 PM Fonda T wrote: Reason for CRM: Pt calling requesting to become an established pt with Dr. Glenard, as she has a family member that also sees Dr. Glenard, and highly recommends provider.  Pt would like a follow up call if this is possible to be accepted as a new pt with provider.  Can be reached back at [phone].

## 2024-10-02 NOTE — Telephone Encounter (Signed)
 At this time she is not taking new patients, we can schedule with others who are in clinic.
